# Patient Record
Sex: Female | Born: 1955 | Race: White | Hispanic: No | Marital: Married | Smoking: Never smoker
Health system: Southern US, Community
[De-identification: ages and names within clinical notes are randomized; demographics above are authoritative.]

## PROBLEM LIST (undated history)

## (undated) DIAGNOSIS — M1990 Unspecified osteoarthritis, unspecified site: Secondary | ICD-10-CM

## (undated) DIAGNOSIS — R519 Headache, unspecified: Secondary | ICD-10-CM

## (undated) DIAGNOSIS — K5792 Diverticulitis of intestine, part unspecified, without perforation or abscess without bleeding: Secondary | ICD-10-CM

## (undated) DIAGNOSIS — M316 Other giant cell arteritis: Secondary | ICD-10-CM

## (undated) DIAGNOSIS — L0390 Cellulitis, unspecified: Secondary | ICD-10-CM

## (undated) DIAGNOSIS — E785 Hyperlipidemia, unspecified: Secondary | ICD-10-CM

## (undated) DIAGNOSIS — T4145XA Adverse effect of unspecified anesthetic, initial encounter: Secondary | ICD-10-CM

## (undated) DIAGNOSIS — M797 Fibromyalgia: Secondary | ICD-10-CM

## (undated) DIAGNOSIS — J45909 Unspecified asthma, uncomplicated: Secondary | ICD-10-CM

## (undated) DIAGNOSIS — R51 Headache: Secondary | ICD-10-CM

## (undated) DIAGNOSIS — T8859XA Other complications of anesthesia, initial encounter: Secondary | ICD-10-CM

## (undated) DIAGNOSIS — M545 Low back pain, unspecified: Secondary | ICD-10-CM

## (undated) DIAGNOSIS — R7303 Prediabetes: Secondary | ICD-10-CM

## (undated) HISTORY — DX: Prediabetes: R73.03

## (undated) HISTORY — DX: Low back pain: M54.5

## (undated) HISTORY — PX: WRIST SURGERY: SHX841

## (undated) HISTORY — PX: OTHER SURGICAL HISTORY: SHX169

## (undated) HISTORY — DX: Unspecified asthma, uncomplicated: J45.909

## (undated) HISTORY — DX: Hyperlipidemia, unspecified: E78.5

## (undated) HISTORY — DX: Headache: R51

## (undated) HISTORY — DX: Other giant cell arteritis: M31.6

## (undated) HISTORY — PX: ROTATOR CUFF REPAIR: SHX139

## (undated) HISTORY — PX: CHOLECYSTECTOMY: SHX55

## (undated) HISTORY — DX: Fibromyalgia: M79.7

## (undated) HISTORY — DX: Headache, unspecified: R51.9

## (undated) HISTORY — PX: TENNIS ELBOW RELEASE/NIRSCHEL PROCEDURE: SHX6651

## (undated) HISTORY — PX: INCISION AND DRAINAGE: SHX5863

## (undated) HISTORY — DX: Low back pain, unspecified: M54.50

## (undated) HISTORY — DX: Unspecified osteoarthritis, unspecified site: M19.90

## (undated) HISTORY — PX: APPENDECTOMY: SHX54

## (undated) HISTORY — PX: BREAST SURGERY: SHX581

## (undated) HISTORY — PX: ANKLE SURGERY: SHX546

## (undated) HISTORY — PX: TEMPORAL ARTERY BIOPSY / LIGATION: SUR132

---

## 2014-04-01 HISTORY — PX: HERNIA REPAIR: SHX51

## 2014-08-12 ENCOUNTER — Ambulatory Visit (INDEPENDENT_AMBULATORY_CARE_PROVIDER_SITE_OTHER): Payer: BLUE CROSS/BLUE SHIELD | Admitting: Internal Medicine

## 2014-08-12 ENCOUNTER — Ambulatory Visit (INDEPENDENT_AMBULATORY_CARE_PROVIDER_SITE_OTHER)
Admission: RE | Admit: 2014-08-12 | Discharge: 2014-08-12 | Disposition: A | Discharge status: Home / Self Care | Payer: BLUE CROSS/BLUE SHIELD | Source: Ambulatory Visit | Attending: Internal Medicine | Admitting: Internal Medicine

## 2014-08-12 ENCOUNTER — Encounter: Payer: Self-pay | Admitting: Internal Medicine

## 2014-08-12 VITALS — BP 124/82 | HR 70 | Ht 61.0 in | Wt 162.0 lb

## 2014-08-12 DIAGNOSIS — J4530 Mild persistent asthma, uncomplicated: Secondary | ICD-10-CM

## 2014-08-12 NOTE — Progress Notes (Signed)
Quick Note:  Spoke with pt and notified of results per Dr. Wert. Pt verbalized understanding and denied any questions.  ______ 

## 2014-08-12 NOTE — Progress Notes (Signed)
Subjective:     Patient ID: Noel JourneyRosemary Ratz, female   DOB: [birth year],    MRN: [id number]  HPI  [age] yowf never smoker dx asthma 2008 in [place] did poorly on advair/ well controlled since 2014 on dulera and complicated by post op Atx /march 2016 s/p [place] referred 08/12/2014 by Shellia CarwinKim Bricoe for asthma eval    08/12/2014 1st [facility] office visit/ Amberlea Spagnuolo   Chief Complaint  Patient presents with  . Pulmonary Consult    Referred by Dr. Anna GenreStephanie Powers. Pt states she was dxed with asthma approx 8 yrs ago. She states her breathing is currently stable.  She states that her symptoms tend to flare in the fall. She does c/o chest tightness- unsure if this is related her fibromyalgia.   Not limited by breathing from desired activities  - chest tightness x years does not necessarily correlate with exac of sob/wheezing or respond to saba  No obvious day to day or daytime variability or assoc chronic cough or classically pleuritic or ex  cp  subjective wheeze or overt sinus or hb symptoms. No unusual exp hx or h/o childhood pna/ asthma or knowledge of premature birth.  Sleeping ok without nocturnal  or early am exacerbation  of respiratory  c/o's or need for noct saba. Also denies any obvious fluctuation of symptoms with weather or environmental changes or other aggravating or alleviating factors except as outlined above   Current Medications, Allergies, Complete Past Medical History, Past Surgical History, Family History, and Social History were reviewed in [facility] electronic medical record.  ROS  The following are not active complaints unless bolded sore throat, dysphagia, dental problems, itching, sneezing,  nasal congestion or excess/ purulent secretions, ear ache,   fever, chills, sweats, unintended wt loss, hemoptysis,  orthopnea pnd or leg swelling, presyncope, palpitations, abdominal pain, anorexia, nausea, vomiting, diarrhea  or change in bowel or bladder  habits, change in stools or urine, dysuria,hematuria,  rash, arthralgias/myalgias x years s change recently, visual complaints, headache, numbness, weakness or ataxia or problems with walking or coordination,  change in mood/affect or memory.         Review of Systems     Objective:   Physical Exam    amb wf nad   Wt Readings from Last 3 Encounters:  08/12/14 162 lb (73.483 kg)    Vital signs reviewed   HEENT: nl dentition, turbinates, and orophanx. Nl external ear canals without cough reflex   NECK :  without JVD/Nodes/TM/ nl carotid upstrokes bilaterally   LUNGS: no acc muscle use, clear to A and P bilaterally without cough on insp or exp maneuvers   CV:  RRR  no s3 or murmur or increase in P2, no edema   ABD:  soft and nontender with nl excursion in the supine position. No bruits or organomegaly, bowel sounds nl  MS:  warm without deformities, calf tenderness, cyanosis or clubbing  SKIN: warm and dry without lesions    NEURO:  alert, approp, no deficits      I personally reviewed images and agree with radiology impression as follows:  CXR:  08/12/2014  The lungs are well-expanded and clear. The heart and pulmonary vascularity are normal. The mediastinum is normal in width. There is no pleural effusion. The bony thorax is unremarkable.  Assessment:

## 2014-08-12 NOTE — Patient Instructions (Addendum)
Nexium 40 mg Take 30-60 min before first meal of the day and pepcid 20 mg at bedtime if any flare of respiratory symptoms   GERD (REFLUX)  is an extremely common cause of respiratory symptoms just like yours , many times with no obvious heartburn at all.    It can be treated with medication, but also with lifestyle changes including elevation of the head of your bed (ideally with 6 inch  bed blocks),  Smoking cessation, avoidance of late meals, excessive alcohol, and avoid fatty foods, chocolate, peppermint, colas, red wine, and acidic juices such as orange juice.  NO MINT OR MENTHOL PRODUCTS SO NO COUGH DROPS  USE SUGARLESS CANDY INSTEAD (Jolley ranchers or Stover's or Life Savers) or even ice chips will also do - the key is to swallow to prevent all throat clearing. NO OIL BASED VITAMINS - use powdered substitutes.  Please remember to go to the x-ray department downstairs for your tests - we will call you with the results when they are available.  Please schedule a follow up office visit in 6 weeks, call sooner if needed with pfts

## 2014-08-13 ENCOUNTER — Encounter: Payer: Self-pay | Admitting: Internal Medicine

## 2014-08-13 NOTE — Assessment & Plan Note (Signed)
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   These are the same pts (now being labeled as having "irritable larynx syndrome" by some cough centers) who not infrequently have a history of having failed to tolerate ace inhibitors,  dry powder inhalers or biphosphonates or report having atypical reflux symptoms that don't respond to standard doses of PPI , and are easily confused as having aecopd or asthma flares by even experienced allergists/ pulmonologists.   My rec therefore is max rx for gerd at the very onset of any resp symptoms at all to see how much better she does with next flare   Total face  time = 6928m review case with pt/ discussion/ counseling/ giving and going over instructions (see avs) and making sure she can use her hfa effectively

## 2014-09-24 ENCOUNTER — Ambulatory Visit (INDEPENDENT_AMBULATORY_CARE_PROVIDER_SITE_OTHER): Payer: BLUE CROSS/BLUE SHIELD | Admitting: Internal Medicine

## 2014-09-24 ENCOUNTER — Encounter: Payer: Self-pay | Admitting: Internal Medicine

## 2014-09-24 VITALS — BP 118/66 | HR 83 | Ht 61.0 in | Wt 167.0 lb

## 2014-09-24 DIAGNOSIS — E669 Obesity, unspecified: Secondary | ICD-10-CM | POA: Insufficient documentation

## 2014-09-24 DIAGNOSIS — J4530 Mild persistent asthma, uncomplicated: Secondary | ICD-10-CM

## 2014-09-24 LAB — PULMONARY FUNCTION TEST
DL/VA % PRED: 111 %
DL/VA: 4.9 ml/min/mmHg/L
DLCO UNC % PRED: 103 %
DLCO unc: 21 ml/min/mmHg
FEF 25-75 PRE: 2.02 L/s
FEF 25-75 Post: 1.92 L/sec
FEF2575-%CHANGE-POST: -4 %
FEF2575-%Pred-Post: 88 %
FEF2575-%Pred-Pre: 92 %
FEV1-%Change-Post: -4 %
FEV1-%PRED-POST: 86 %
FEV1-%Pred-Pre: 90 %
FEV1-Post: 1.97 L
FEV1-Pre: 2.05 L
FEV1FVC-%Change-Post: 2 %
FEV1FVC-%Pred-Pre: 104 %
FEV6-%Change-Post: -6 %
FEV6-%PRED-POST: 82 %
FEV6-%Pred-Pre: 88 %
FEV6-POST: 2.35 L
FEV6-PRE: 2.52 L
FEV6FVC-%PRED-POST: 103 %
FEV6FVC-%PRED-PRE: 103 %
FVC-%CHANGE-POST: -6 %
FVC-%PRED-POST: 80 %
FVC-%Pred-Pre: 85 %
FVC-Post: 2.35 L
FVC-Pre: 2.52 L
Post FEV1/FVC ratio: 84 %
Post FEV6/FVC ratio: 100 %
Pre FEV1/FVC ratio: 82 %
Pre FEV6/FVC Ratio: 100 %

## 2014-09-24 NOTE — Progress Notes (Signed)
Subjective:     Patient ID: Erica Caldwell, female   DOB: 02/24/1955    MRN: [id number]    Brief patient profile:  65 yowf never smoker dx asthma 2008 in [place] did poorly on advair/ well controlled since 2014 on dulera and complicated by post op Atx /march 2016 s/p ventral hernia repair in [place] referred 08/12/2014 by Shellia Carwin for asthma eval. With nl pfts on dulera 200bid/singulair maint rx 09/24/2014    History of Present Illness  08/12/2014 1st [facility] office visit/ Wert   Chief Complaint  Patient presents with  . Pulmonary Consult    Referred by Dr. Anna Genre. Pt states she was dxed with asthma approx 8 yrs ago. She states her breathing is currently stable.  She states that her symptoms tend to flare in the fall. She does c/o chest tightness- unsure if this is related her fibromyalgia.   Not limited by breathing from desired activities  - chest tightness x years does not necessarily correlate with exac of sob/wheezing or respond to saba rec Nexium 40 mg Take 30-60 min before first meal of the day and pepcid 20 mg at bedtime if any flare of respiratory symptoms  GERD diet      09/24/2014 f/u ov/Wert re: ? Asthma on dulera 200 2 each and no rescue with pfts nl except for low erv  Chief Complaint  Patient presents with  . Follow-up    PFT done today. Pt states that her breathing is doing well.  She has not had to use rescue inhaler.      No obvious day to day or daytime variability or assoc chronic cough or classically pleuritic or ex  cp  subjective wheeze or overt sinus or hb symptoms. No unusual exp hx or h/o childhood pna/ asthma or knowledge of premature birth.  Sleeping ok without nocturnal  or early am exacerbation  of respiratory  c/o's or need for noct saba. Also denies any obvious fluctuation of symptoms with weather or environmental changes or other aggravating or alleviating factors except as outlined above   Current Medications, Allergies,  Complete Past Medical History, Past Surgical History, Family History, and Social History were reviewed in [facility] record.  ROS  The following are not active complaints unless bolded sore throat, dysphagia, dental problems, itching, sneezing,  nasal congestion or excess/ purulent secretions, ear ache,   fever, chills, sweats, unintended wt loss, hemoptysis,  orthopnea pnd or leg swelling, presyncope, palpitations, abdominal pain, anorexia, nausea, vomiting, diarrhea  or change in bowel or bladder habits, change in stools or urine, dysuria,hematuria,  rash, arthralgias/myalgias x years s change  , visual complaints, headache, numbness, weakness or ataxia or problems with walking or coordination,  change in mood/affect or memory.             Objective:   Physical Exam    amb wf nad  09/24/2014        167  Wt Readings from Last 3 Encounters:  08/12/14 162 lb (73.483 kg)    Vital signs reviewed   HEENT: nl dentition, turbinates, and orophanx. Nl external ear canals without cough reflex   NECK :  without JVD/Nodes/TM/ nl carotid upstrokes bilaterally   LUNGS: no acc muscle use, clear to A and P bilaterally without cough on insp or exp maneuvers   CV:  RRR  no s3 or murmur or increase in P2, no edema   ABD:  soft and nontender with nl excursion in the  supine position. No bruits or organomegaly, bowel sounds nl  MS:  warm without deformities, calf tenderness, cyanosis or clubbing  SKIN: warm and dry without lesions    NEURO:  alert, approp, no deficits      I personally reviewed images and agree with radiology impression as follows:  CXR:  08/12/2014  The lungs are well-expanded and clear. The heart and pulmonary vascularity are normal. The mediastinum is normal in width. There is no pleural effusion. The bony thorax is unremarkable.  Assessment:     Outpatient Encounter Prescriptions as of 09/24/2014  Medication Sig  . acetaminophen (TYLENOL) 325  MG tablet Take 650 mg by mouth every 6 (six) hours as needed.  Marland Kitchen albuterol (PROAIR HFA) 108 (90 BASE) MCG/ACT inhaler Inhale 2 puffs into the lungs every 6 (six) hours as needed for wheezing or shortness of breath.  . Calcium Carb-Cholecalciferol (CALCIUM 600 + D PO) Take 1 tablet by mouth daily.  . clindamycin (CLEOCIN) 300 MG capsule Take 300 mg by mouth 4 (four) times daily.  Marland Kitchen dexlansoprazole (DEXILANT) 60 MG capsule Take 60 mg by mouth daily.  . Flavocoxid (LIMBREL) 500 MG CAPS Take 1 capsule by mouth 2 (two) times daily.  . folic acid (FOLVITE) 400 MCG tablet Take 400 mcg by mouth daily.  Marland Kitchen gabapentin (NEURONTIN) 300 MG capsule Take 600 mg by mouth at bedtime.   Marland Kitchen levothyroxine (SYNTHROID, LEVOTHROID) 75 MCG tablet Take 75 mcg by mouth daily before breakfast.  . loratadine (CLARITIN) 10 MG tablet Take 10 mg by mouth daily.  Marland Kitchen lovastatin (MEVACOR) 40 MG tablet Take 40 mg by mouth at bedtime.  . mometasone-formoterol (DULERA) 200-5 MCG/ACT AERO Inhale 2 puffs into the lungs daily.  . montelukast (SINGULAIR) 10 MG tablet Take 10 mg by mouth at bedtime.  Marland Kitchen venlafaxine XR (EFFEXOR-XR) 37.5 MG 24 hr capsule Take 37.5 mg by mouth daily with breakfast.  . [DISCONTINUED] esomeprazole (NEXIUM) 40 MG capsule Take 40 mg by mouth daily at 12 noon.  . [DISCONTINUED] fexofenadine (ALLEGRA) 180 MG tablet Take 180 mg by mouth daily.  . [DISCONTINUED] oxyCODONE-acetaminophen (PERCOCET/ROXICET) 5-325 MG per tablet Take 1-2 tablets by mouth every 4 (four) hours as needed for severe pain.   No facility-administered encounter medications on file as of 09/24/2014.

## 2014-09-24 NOTE — Assessment & Plan Note (Addendum)
All goals of chronic asthma control met including optimal function and elimination of symptoms with minimal need for rescue therapy.  Contingencies discussed in full including contacting this office immediately if not controlling the symptoms using the rule of two's.    I had an extended summary final  discussion with the patient reviewing all relevant studies completed to date and  lasting 15 to 20 minutes of a 25 minute visit   1) the only remaining abnormality on PFTs is related to her weight, not airflow obstruction.  2) since she is doing so well I think it's reasonable to continue dulera 200 2 puffs every morning and then add the second 2 puffs for any breakthrough symptoms   3) Each maintenance medication was reviewed in detail including most importantly the difference between maintenance and prns and under what circumstances the prns are to be triggered using an action plan format that is not reflected in the computer generated alphabetically organized AVS.    Please see instructions for details which were reviewed in writing and the patient given a copy highlighting the part that I personally wrote and discussed at today's ov.

## 2014-09-24 NOTE — Assessment & Plan Note (Signed)
Body mass index is 31.57  No results found for: TSH   Contributing to low ERV on pfts 09/24/2014 and gerd tendency/ doe/reviewed need  achieve and maintain neg calorie balance > defer f/u primary care including intermittently monitoring thyroid status

## 2014-09-24 NOTE — Progress Notes (Signed)
PFT performed today. Patient refused to perform lung volumes due to a bad experience in the past.

## 2014-09-24 NOTE — Patient Instructions (Signed)
Continue dulera 200 2 each am with the second dose of 2 pffs 12 hours later if any respiratory symptoms  Blow dulera out thru nose    If you are satisfied with your treatment plan,  let your doctor know and he/she can either refill your medications or you can return here when your prescription runs out.     If in any way you are not 100% satisfied,  please tell us.  If 100% better, tell your friends!  Pulmonary follow up is as needed

## 2014-09-25 ENCOUNTER — Encounter: Payer: Self-pay | Admitting: Internal Medicine

## 2016-01-14 ENCOUNTER — Other Ambulatory Visit: Payer: Self-pay | Admitting: Rheumatology

## 2016-01-19 ENCOUNTER — Other Ambulatory Visit: Payer: Self-pay | Admitting: Rheumatology

## 2016-01-19 DIAGNOSIS — M316 Other giant cell arteritis: Secondary | ICD-10-CM

## 2016-01-27 ENCOUNTER — Ambulatory Visit
Admission: RE | Admit: 2016-01-27 | Discharge: 2016-01-27 | Disposition: A | Discharge status: Home / Self Care | Payer: BLUE CROSS/BLUE SHIELD | Source: Ambulatory Visit | Attending: Rheumatology | Admitting: Rheumatology

## 2016-01-27 DIAGNOSIS — M316 Other giant cell arteritis: Secondary | ICD-10-CM

## 2016-08-29 ENCOUNTER — Encounter: Payer: Self-pay | Admitting: Neurology

## 2016-08-29 ENCOUNTER — Encounter: Payer: Self-pay | Admitting: *Deleted

## 2016-08-29 ENCOUNTER — Ambulatory Visit (INDEPENDENT_AMBULATORY_CARE_PROVIDER_SITE_OTHER): Payer: BLUE CROSS/BLUE SHIELD | Admitting: Neurology

## 2016-08-29 DIAGNOSIS — G43709 Chronic migraine without aura, not intractable, without status migrainosus: Secondary | ICD-10-CM | POA: Diagnosis not present

## 2016-08-29 DIAGNOSIS — IMO0002 Reserved for concepts with insufficient information to code with codable children: Secondary | ICD-10-CM | POA: Insufficient documentation

## 2016-08-29 MED ORDER — SUMATRIPTAN SUCCINATE 50 MG PO TABS: 50.0000 mg | ORAL_TABLET | ORAL | 6 refills | Status: AC | PRN

## 2016-08-29 MED ORDER — TOPIRAMATE 100 MG PO TABS: 100.0000 mg | ORAL_TABLET | Freq: Two times a day (BID) | ORAL | 11 refills | Status: DC

## 2016-08-29 NOTE — Progress Notes (Signed)
PATIENT: Erica Caldwell DOB: 04-19-1955  Chief Complaint  Patient presents with  . Migraine    Reports frequent headaches associated with vision disturbances.  She is currently being treated for temporal arteritis.  She has recently had an unremarkable eye exam.  . Rheumatology    Lahoma Rocker, MD  . PCP    Katherina Mires, MD     HISTORICAL  Erica Caldwell is a 61 years old right-handed female, seen in refer by her primary care doctor Lahoma Rocker for evaluation of chronic migraine, initial evaluation was on August 29 2016.  I reviewed and summarized most recent referring, she had a history of arthritis, fibromyalgia, chronic low back pain, asthma, had a history of left rotator cuff repair in 2014, left wrist, right ankle surgery, temporal artery biopsy in October 2017, there was normal.  She has a long history of migraine headache, typical migraine are right lateralized severe pounding headache with associated light noise sensitivity, nauseous, lasting 2-3 days, during the headache, she often have flashing light in her visual field proceeding to the onset of headaches, per patient, she was treated with antiepileptic medication many years ago, which has helped her headache,  She rarely has migraine headaches, round August 2017, she began to develop right persistent temporoparietal area low pressure pain, skin sensitivity, initially was intermittent, but September 2017, she noticed persistent symptoms, also complains of jaw pain, hurt to chew, as soon intermittent blurry vision, she was seen by ENT, had first right temporal artery biopsy, there was normal,  Per record, January 13 2016, sensory deficit, C-reactive protein of 6,  Clinically she was highly suspicious for joints cell arteritis, carotid Dopplers showed no significant stenosis, she was started on prednisone 60 mg daily in December 2017, complains of significant side effect including increased blood pressure, feeling  anxious, palpitation, was lowered to 40 mg daily in 2 weeks, her symptoms improved and resolved but she continue have side effect, then prednisone dose was continued tapered off the lower dose, while she was taking prednisone 20 mg daily by April 06 2016, she noticed recurrent right temporal headaches, jaw claudication, right skull sensitivity, the prednisone dosage was increased to 25 mg daily, noticed improvement of her symptoms,   Over the last few months, her prednisone was continued tapering to 3 mg on Jun 22 2016, she continue complains of low-grade right-sided pressure headache, skin sensitivity, she was recently treated with higher dose of prednisone 40 mg daily because of her upper respiratory symptoms, she denies significant improvement,  She also tried over-the-counter Tylenol, NSAIDs for headache with limited help, she did has diffuse body achy pain, fibromyalgia,  Laboratory evaluations in July 2018, normal C-reactive protein 0.5, mild elevated WBC 11.2, normal hemoglobin of 15, CMP with glucose of 109, normal ESR,   REVIEW OF SYSTEMS: Full 14 system review of systems performed and notable only for as above   ALLERGIES: Allergies  Allergen Reactions  . Levaquin  [Levofloxacin In D5w] Palpitations  . Other Itching and Rash    Perfume- shortness of breath; watery eyes; headache  . Latex Hives  . Penicillins Other (See Comments)    Unsure of reaction  . Silver Hives    Patient complains of blisters/itching all over body.   . Cefazolin Other (See Comments)  . Chloraprep One Step [Chlorhexidine Gluconate] Rash  . Tape Rash    Including tegaderm    HOME MEDICATIONS: Current Outpatient Prescriptions  Medication Sig Dispense Refill  . acetaminophen (TYLENOL) 325  MG tablet Take 650 mg by mouth every 6 (six) hours as needed.    Marland Kitchen albuterol (PROAIR HFA) 108 (90 BASE) MCG/ACT inhaler Inhale 2 puffs into the lungs every 6 (six) hours as needed for wheezing or shortness of breath.    Erica Caldwell Calcium (STOOL SOFTENER PO) Take by mouth as needed.    . DULoxetine (CYMBALTA) 30 MG capsule Take 30 mg by mouth daily.    Marland Kitchen esomeprazole (NEXIUM) 40 MG capsule Take 40 mg by mouth daily at 12 noon.    . gabapentin (NEURONTIN) 300 MG capsule Take 900 mg by mouth at bedtime.    Marland Kitchen HYDROcodone-acetaminophen (NORCO/VICODIN) 5-325 MG tablet Take 1-2 tablets by mouth every 6 (six) hours as needed for moderate pain.    Marland Kitchen levothyroxine (SYNTHROID, LEVOTHROID) 75 MCG tablet Take 75 mcg by mouth daily before breakfast.    . lovastatin (MEVACOR) 40 MG tablet Take 40 mg by mouth at bedtime.    . metFORMIN (GLUCOPHAGE-XR) 500 MG 24 hr tablet Take 500 mg by mouth daily with breakfast.    . mometasone-formoterol (DULERA) 200-5 MCG/ACT AERO Inhale 2 puffs into the lungs daily.    . predniSONE (DELTASONE) 20 MG tablet Take 40 mg by mouth daily with breakfast.    . Triamcinolone Acetonide (TRIAMCINOLONE 0.1 % CREAM : EUCERIN) CREA Apply 1 application topically as needed.     No current facility-administered medications for this visit.     PAST MEDICAL HISTORY: Past Medical History:  Diagnosis Date  . Arthritis   . Asthma   . Fibromyalgia   . Headache   . Hyperlipidemia   . Low back pain   . Pre-diabetes   . Temporal arteritis ([place])     PAST SURGICAL HISTORY: Past Surgical History:  Procedure Laterality Date  . ANKLE SURGERY Right   . APPENDECTOMY    . CARPAL TUNNEL RELEASE Left   . HERNIA REPAIR  04/01/14  . ROTATOR CUFF REPAIR Left    x 2  . TEMPORAL ARTERY BIOPSY / LIGATION    . WRIST SURGERY Left     FAMILY HISTORY: Family History  Problem Relation Age of Onset  . Allergies Brother   . Irritable bowel syndrome Brother   . Asthma Sister   . Irritable bowel syndrome Sister   . Heart disease Mother   . Arthritis Mother   . Stroke Mother   . Heart attack Mother   . Heart disease Father   . Colon cancer Father   . Stroke Father   . Heart attack Father   . Other Father         Guillain-Barre    SOCIAL HISTORY:  Social History   Social History  . Marital status: Married    Spouse name: N/A  . Number of children: 3  . Years of education: HS   Occupational History  . Retired    Social History Main Topics  . Smoking status: Never Smoker  . Smokeless tobacco: Never Used  . Alcohol use No  . Drug use: No  . Sexual activity: Not on file   Other Topics Concern  . Not on file   Social History Narrative   Lives at home with husband, parents, sister and brother-in-law.   Right-handed.   No caffeine on a daily basis.     PHYSICAL EXAM   Vitals:   08/29/16 1152  BP: 129/80  Pulse: 78  Weight: 172 lb 8 oz (78.2 kg)  Height: 5' 1"  (1.549  m)    Not recorded      Body mass index is 32.59 kg/m.  PHYSICAL EXAMNIATION:  Gen: NAD, conversant, well nourised, obese, well groomed                     Cardiovascular: Regular rate rhythm, no peripheral edema, warm, nontender. Eyes: Conjunctivae clear without exudates or hemorrhage Neck: Supple, no carotid bruits. Pulmonary: Clear to auscultation bilaterally   NEUROLOGICAL EXAM:  MENTAL STATUS: Speech:    Speech is normal; fluent and spontaneous with normal comprehension.  Cognition:     Orientation to time, place and person     Normal recent and remote memory     Normal Attention span and concentration     Normal Language, naming, repeating,spontaneous speech     Fund of knowledge   CRANIAL NERVES: CN II: Visual fields are full to confrontation. Fundoscopic exam is normal with sharp discs and no vascular changes. Pupils are round equal and briskly reactive to light. CN III, IV, VI: extraocular movement are normal. No ptosis. CN V: Facial sensation is intact to pinprick in all 3 divisions bilaterally. Corneal responses are intact.  CN VII: Face is symmetric with normal eye closure and smile. CN VIII: Hearing is normal to rubbing fingers CN IX, X: Palate elevates symmetrically. Phonation is  normal. CN XI: Head turning and shoulder shrug are intact CN XII: Tongue is midline with normal movements and no atrophy.  MOTOR: There is no pronator drift of out-stretched arms. Muscle bulk and tone are normal. Muscle strength is normal.  REFLEXES: Reflexes are 2+ and symmetric at the biceps, triceps, knees, and ankles. Plantar responses are flexor.  SENSORY: Intact to light touch, pinprick, positional sensation and vibratory sensation are intact in fingers and toes.  COORDINATION: Rapid alternating movements and fine finger movements are intact. There is no dysmetria on finger-to-nose and heel-knee-shin.    GAIT/STANCE: Posture is normal. Gait is steady with normal steps, base, arm swing, and turning. Heel and toe walking are normal. Tandem gait is normal.  Romberg is absent.   DIAGNOSTIC DATA (LABS, IMAGING, TESTING) - I reviewed patient records, labs, notes, testing and imaging myself where available.   ASSESSMENT AND PLAN  Erica Caldwell is a 61 y.o. female   Chronic migraine headaches   new onset persistent headache still with migraine features  MRI brain to rule out structural relation  Add on Topamax 100 mg twice a day   Imitrex 50 mg as needed     Marcial Pacas, M.D. Ph.D.  [facility] [address], [place], [place] [address] Ph: [phone] Fax: [phone]  CC: Lahoma Rocker, MD

## 2016-09-05 ENCOUNTER — Telehealth: Payer: Self-pay | Admitting: Neurology

## 2016-09-05 NOTE — Telephone Encounter (Signed)
Per Dr Terrace ArabiaYan, patient can try cutting 100mg  tablet in half and take 50mg  at bedtime for 2 weeks. If she is doing well, she can increase to whole tablet (100mg ) at bedtime. If she does well with this, dose can be increased from here.

## 2016-09-05 NOTE — Telephone Encounter (Signed)
Called and spoke with patient. Relayed message below. She verbalized understanding. She will call back if she has any further questions or concerns.

## 2016-09-05 NOTE — Telephone Encounter (Signed)
I called the patient to remind her of her MRI appointment this Wednesday. She informed me that she is having problems with the Topamax at 100 mg she was wondering if she could go down to 50 mg?

## 2016-09-05 NOTE — Telephone Encounter (Signed)
Called patient back. She takes 100mg  topamax at bedtime. Makes her very sleepy. Cannot take daytime dose d/t this. Patient wondering if she can she lower the dose to see if she tolerates it okay. Started rx 08/29/16.  I educated patient that it may take 2-3 weeks for her to adjust to new dose of medication. She would still like me to talk with Dr Terrace ArabiaYan to see if she can lower dose.   Verified pharmacy:  [facility] [phone] [place] [facility] [place] [facility] [phone] [facility] [phone] (Phone) [phone] (Fax)

## 2016-09-07 ENCOUNTER — Ambulatory Visit (INDEPENDENT_AMBULATORY_CARE_PROVIDER_SITE_OTHER): Payer: BLUE CROSS/BLUE SHIELD

## 2016-09-07 DIAGNOSIS — G43709 Chronic migraine without aura, not intractable, without status migrainosus: Secondary | ICD-10-CM | POA: Diagnosis not present

## 2016-09-07 DIAGNOSIS — IMO0002 Reserved for concepts with insufficient information to code with codable children: Secondary | ICD-10-CM

## 2016-09-12 ENCOUNTER — Telehealth: Payer: Self-pay | Admitting: *Deleted

## 2016-09-12 NOTE — Telephone Encounter (Signed)
Spoke to pt and relayed that her MRI brain study was normal.  She verbalized understanding.

## 2016-09-12 NOTE — Telephone Encounter (Signed)
Spoke to pt and relayed that her MRI brain resulted in normal study.  She verbalized understanding.

## 2016-09-12 NOTE — Telephone Encounter (Signed)
-----   Message from Levert FeinsteinYijun Yan, MD sent at 09/09/2016  9:07 AM EDT ----- Please call pt for normal MRI brain.

## 2016-10-10 ENCOUNTER — Telehealth: Payer: Self-pay | Admitting: Nurse Practitioner

## 2016-10-10 NOTE — Telephone Encounter (Signed)
Pt called to r/s appt with Dr Terrace ArabiaYan, she is booked into the middle of November. So an appt was scheduled with Eber Jonesarolyn on 10/29, the pt is being seen for migraines.  FYI

## 2016-10-13 ENCOUNTER — Ambulatory Visit: Payer: BLUE CROSS/BLUE SHIELD | Admitting: Neurology

## 2016-11-01 ENCOUNTER — Inpatient Hospital Stay (HOSPITAL_COMMUNITY)
Admission: EM | Admit: 2016-11-01 | Discharge: 2016-11-08 | DRG: 330 | Disposition: A | Discharge status: Home Health | Payer: BLUE CROSS/BLUE SHIELD | Attending: General Surgery | Admitting: General Surgery

## 2016-11-01 ENCOUNTER — Encounter (HOSPITAL_COMMUNITY): Payer: Self-pay | Admitting: Family Medicine

## 2016-11-01 DIAGNOSIS — Z91040 Latex allergy status: Secondary | ICD-10-CM

## 2016-11-01 DIAGNOSIS — E876 Hypokalemia: Secondary | ICD-10-CM

## 2016-11-01 DIAGNOSIS — K631 Perforation of intestine (nontraumatic): Secondary | ICD-10-CM | POA: Diagnosis present

## 2016-11-01 DIAGNOSIS — Z6832 Body mass index (BMI) 32.0-32.9, adult: Secondary | ICD-10-CM

## 2016-11-01 DIAGNOSIS — Z800 Family history of malignant neoplasm of digestive organs: Secondary | ICD-10-CM

## 2016-11-01 DIAGNOSIS — E039 Hypothyroidism, unspecified: Secondary | ICD-10-CM | POA: Diagnosis present

## 2016-11-01 DIAGNOSIS — K5720 Diverticulitis of large intestine with perforation and abscess without bleeding: Principal | ICD-10-CM | POA: Diagnosis present

## 2016-11-01 DIAGNOSIS — M1990 Unspecified osteoarthritis, unspecified site: Secondary | ICD-10-CM | POA: Diagnosis present

## 2016-11-01 DIAGNOSIS — K660 Peritoneal adhesions (postprocedural) (postinfection): Secondary | ICD-10-CM | POA: Diagnosis present

## 2016-11-01 DIAGNOSIS — Z881 Allergy status to other antibiotic agents status: Secondary | ICD-10-CM

## 2016-11-01 DIAGNOSIS — G43909 Migraine, unspecified, not intractable, without status migrainosus: Secondary | ICD-10-CM | POA: Diagnosis present

## 2016-11-01 DIAGNOSIS — Z91048 Other nonmedicinal substance allergy status: Secondary | ICD-10-CM

## 2016-11-01 DIAGNOSIS — Z825 Family history of asthma and other chronic lower respiratory diseases: Secondary | ICD-10-CM

## 2016-11-01 DIAGNOSIS — Z7952 Long term (current) use of systemic steroids: Secondary | ICD-10-CM

## 2016-11-01 DIAGNOSIS — Z880 Allergy status to penicillin: Secondary | ICD-10-CM

## 2016-11-01 DIAGNOSIS — K668 Other specified disorders of peritoneum: Secondary | ICD-10-CM

## 2016-11-01 DIAGNOSIS — R7303 Prediabetes: Secondary | ICD-10-CM | POA: Diagnosis present

## 2016-11-01 DIAGNOSIS — M316 Other giant cell arteritis: Secondary | ICD-10-CM | POA: Diagnosis present

## 2016-11-01 DIAGNOSIS — E669 Obesity, unspecified: Secondary | ICD-10-CM | POA: Diagnosis present

## 2016-11-01 DIAGNOSIS — M797 Fibromyalgia: Secondary | ICD-10-CM | POA: Diagnosis present

## 2016-11-01 DIAGNOSIS — K567 Ileus, unspecified: Secondary | ICD-10-CM | POA: Diagnosis present

## 2016-11-01 DIAGNOSIS — Z888 Allergy status to other drugs, medicaments and biological substances status: Secondary | ICD-10-CM

## 2016-11-01 DIAGNOSIS — E785 Hyperlipidemia, unspecified: Secondary | ICD-10-CM | POA: Diagnosis present

## 2016-11-01 DIAGNOSIS — J45909 Unspecified asthma, uncomplicated: Secondary | ICD-10-CM | POA: Diagnosis present

## 2016-11-01 DIAGNOSIS — Z8261 Family history of arthritis: Secondary | ICD-10-CM

## 2016-11-01 DIAGNOSIS — R12 Heartburn: Secondary | ICD-10-CM | POA: Diagnosis not present

## 2016-11-01 NOTE — ED Triage Notes (Signed)
Patient is from home and transported via [facility]. Per EMS, patient is experiencing left groin pain that started yesterday but got worse tonight. Also, complains of nausea. Patient denied no urinary symptoms or recent injury. Patient did have recent right foot surgery recently. Patients spouse reported to EMS he relates her groin pain to overuse of left side from right sided surgery. Patient was in a wheelchair but is able to pivot. Patient has took Tylenol for symptoms.

## 2016-11-02 ENCOUNTER — Encounter (HOSPITAL_COMMUNITY): Admission: EM | Disposition: A | Discharge status: Home Health | Payer: Self-pay | Source: Home / Self Care

## 2016-11-02 ENCOUNTER — Inpatient Hospital Stay (HOSPITAL_COMMUNITY): Payer: BLUE CROSS/BLUE SHIELD | Admitting: Anesthesiology

## 2016-11-02 ENCOUNTER — Encounter (HOSPITAL_COMMUNITY): Payer: Self-pay

## 2016-11-02 ENCOUNTER — Emergency Department (HOSPITAL_COMMUNITY): Payer: BLUE CROSS/BLUE SHIELD

## 2016-11-02 DIAGNOSIS — K631 Perforation of intestine (nontraumatic): Secondary | ICD-10-CM | POA: Diagnosis present

## 2016-11-02 DIAGNOSIS — K668 Other specified disorders of peritoneum: Secondary | ICD-10-CM | POA: Diagnosis present

## 2016-11-02 DIAGNOSIS — Z880 Allergy status to penicillin: Secondary | ICD-10-CM | POA: Diagnosis not present

## 2016-11-02 DIAGNOSIS — K5720 Diverticulitis of large intestine with perforation and abscess without bleeding: Secondary | ICD-10-CM | POA: Diagnosis present

## 2016-11-02 DIAGNOSIS — M797 Fibromyalgia: Secondary | ICD-10-CM | POA: Diagnosis present

## 2016-11-02 DIAGNOSIS — E669 Obesity, unspecified: Secondary | ICD-10-CM | POA: Diagnosis present

## 2016-11-02 DIAGNOSIS — Z91048 Other nonmedicinal substance allergy status: Secondary | ICD-10-CM | POA: Diagnosis not present

## 2016-11-02 DIAGNOSIS — K660 Peritoneal adhesions (postprocedural) (postinfection): Secondary | ICD-10-CM | POA: Diagnosis present

## 2016-11-02 DIAGNOSIS — M1990 Unspecified osteoarthritis, unspecified site: Secondary | ICD-10-CM | POA: Diagnosis present

## 2016-11-02 DIAGNOSIS — Z8261 Family history of arthritis: Secondary | ICD-10-CM | POA: Diagnosis not present

## 2016-11-02 DIAGNOSIS — E785 Hyperlipidemia, unspecified: Secondary | ICD-10-CM | POA: Diagnosis present

## 2016-11-02 DIAGNOSIS — Z881 Allergy status to other antibiotic agents status: Secondary | ICD-10-CM | POA: Diagnosis not present

## 2016-11-02 DIAGNOSIS — K567 Ileus, unspecified: Secondary | ICD-10-CM | POA: Diagnosis present

## 2016-11-02 DIAGNOSIS — J45909 Unspecified asthma, uncomplicated: Secondary | ICD-10-CM | POA: Diagnosis present

## 2016-11-02 DIAGNOSIS — G43909 Migraine, unspecified, not intractable, without status migrainosus: Secondary | ICD-10-CM | POA: Diagnosis present

## 2016-11-02 DIAGNOSIS — Z800 Family history of malignant neoplasm of digestive organs: Secondary | ICD-10-CM | POA: Diagnosis not present

## 2016-11-02 DIAGNOSIS — Z7952 Long term (current) use of systemic steroids: Secondary | ICD-10-CM | POA: Diagnosis not present

## 2016-11-02 DIAGNOSIS — R12 Heartburn: Secondary | ICD-10-CM | POA: Diagnosis not present

## 2016-11-02 DIAGNOSIS — M316 Other giant cell arteritis: Secondary | ICD-10-CM | POA: Diagnosis present

## 2016-11-02 DIAGNOSIS — Z91040 Latex allergy status: Secondary | ICD-10-CM | POA: Diagnosis not present

## 2016-11-02 DIAGNOSIS — Z888 Allergy status to other drugs, medicaments and biological substances status: Secondary | ICD-10-CM | POA: Diagnosis not present

## 2016-11-02 DIAGNOSIS — E039 Hypothyroidism, unspecified: Secondary | ICD-10-CM | POA: Diagnosis present

## 2016-11-02 DIAGNOSIS — R7303 Prediabetes: Secondary | ICD-10-CM | POA: Diagnosis present

## 2016-11-02 DIAGNOSIS — Z6832 Body mass index (BMI) 32.0-32.9, adult: Secondary | ICD-10-CM | POA: Diagnosis not present

## 2016-11-02 DIAGNOSIS — Z825 Family history of asthma and other chronic lower respiratory diseases: Secondary | ICD-10-CM | POA: Diagnosis not present

## 2016-11-02 HISTORY — PX: LAPAROTOMY: SHX154

## 2016-11-02 LAB — CBC WITH DIFFERENTIAL/PLATELET
Basophils Absolute: 0 10*3/uL (ref 0.0–0.1)
Basophils Relative: 0 %
Eosinophils Absolute: 0.1 10*3/uL (ref 0.0–0.7)
Eosinophils Relative: 1 %
HEMATOCRIT: 44.2 % (ref 36.0–46.0)
HEMOGLOBIN: 14.1 g/dL (ref 12.0–15.0)
LYMPHS ABS: 2.6 10*3/uL (ref 0.7–4.0)
Lymphocytes Relative: 24 %
MCH: 30.1 pg (ref 26.0–34.0)
MCHC: 31.9 g/dL (ref 30.0–36.0)
MCV: 94.2 fL (ref 78.0–100.0)
MONOS PCT: 7 %
Monocytes Absolute: 0.8 10*3/uL (ref 0.1–1.0)
NEUTROS ABS: 7.4 10*3/uL (ref 1.7–7.7)
NEUTROS PCT: 68 %
Platelets: 172 10*3/uL (ref 150–400)
RBC: 4.69 MIL/uL (ref 3.87–5.11)
RDW: 15.2 % (ref 11.5–15.5)
WBC: 10.9 10*3/uL — AB (ref 4.0–10.5)

## 2016-11-02 LAB — URINALYSIS, ROUTINE W REFLEX MICROSCOPIC
Bilirubin Urine: NEGATIVE
GLUCOSE, UA: NEGATIVE mg/dL
Hgb urine dipstick: NEGATIVE
KETONES UR: NEGATIVE mg/dL
Nitrite: NEGATIVE
PROTEIN: NEGATIVE mg/dL
Specific Gravity, Urine: 1.012 (ref 1.005–1.030)
pH: 6 (ref 5.0–8.0)

## 2016-11-02 LAB — COMPREHENSIVE METABOLIC PANEL
ALT: 24 U/L (ref 14–54)
ANION GAP: 7 (ref 5–15)
AST: 23 U/L (ref 15–41)
Albumin: 3.7 g/dL (ref 3.5–5.0)
Alkaline Phosphatase: 35 U/L — ABNORMAL LOW (ref 38–126)
BUN: 7 mg/dL (ref 6–20)
CHLORIDE: 115 mmol/L — AB (ref 101–111)
CO2: 24 mmol/L (ref 22–32)
CREATININE: 0.81 mg/dL (ref 0.44–1.00)
Calcium: 9.3 mg/dL (ref 8.9–10.3)
Glucose, Bld: 131 mg/dL — ABNORMAL HIGH (ref 65–99)
Potassium: 2.9 mmol/L — ABNORMAL LOW (ref 3.5–5.1)
SODIUM: 146 mmol/L — AB (ref 135–145)
Total Bilirubin: 0.7 mg/dL (ref 0.3–1.2)
Total Protein: 5.6 g/dL — ABNORMAL LOW (ref 6.5–8.1)

## 2016-11-02 LAB — MRSA PCR SCREENING: MRSA BY PCR: NEGATIVE

## 2016-11-02 LAB — LIPASE, BLOOD: Lipase: 28 U/L (ref 11–51)

## 2016-11-02 SURGERY — LAPAROTOMY, EXPLORATORY
Anesthesia: General

## 2016-11-02 MED ORDER — DIPHENHYDRAMINE HCL 50 MG/ML IJ SOLN: 12.5000 mg | Freq: Four times a day (QID) | INTRAMUSCULAR | Status: DC | PRN

## 2016-11-02 MED ORDER — METHOCARBAMOL 500 MG PO TABS
500.0000 mg | ORAL_TABLET | Freq: Four times a day (QID) | ORAL | Status: DC | PRN
  Administered 2016-11-03: 500 mg via ORAL
  Filled 2016-11-02: qty 1

## 2016-11-02 MED ORDER — MONTELUKAST SODIUM 10 MG PO TABS
10.0000 mg | ORAL_TABLET | Freq: Every day | ORAL | Status: DC
  Administered 2016-11-02 – 2016-11-07 (×6): 10 mg via ORAL
  Filled 2016-11-02 (×6): qty 1

## 2016-11-02 MED ORDER — ONDANSETRON 4 MG PO TBDP
4.0000 mg | ORAL_TABLET | Freq: Four times a day (QID) | ORAL | Status: DC | PRN
Start: 2016-11-02 — End: 2016-11-02

## 2016-11-02 MED ORDER — ROCURONIUM BROMIDE 10 MG/ML (PF) SYRINGE
PREFILLED_SYRINGE | INTRAVENOUS | Status: DC | PRN
  Administered 2016-11-02: 30 mg via INTRAVENOUS

## 2016-11-02 MED ORDER — DIPHENHYDRAMINE HCL 12.5 MG/5ML PO ELIX: 12.5000 mg | ORAL_SOLUTION | Freq: Four times a day (QID) | ORAL | Status: DC | PRN

## 2016-11-02 MED ORDER — PREDNISONE 1 MG PO TABS
1.0000 mg | ORAL_TABLET | Freq: Every day | ORAL | Status: DC
  Administered 2016-11-02 – 2016-11-08 (×7): 1 mg via ORAL
  Filled 2016-11-02 (×9): qty 1

## 2016-11-02 MED ORDER — NALOXONE HCL 0.4 MG/ML IJ SOLN: 0.4000 mg | INTRAMUSCULAR | Status: DC | PRN

## 2016-11-02 MED ORDER — HYDROMORPHONE HCL-NACL 0.5-0.9 MG/ML-% IV SOSY
PREFILLED_SYRINGE | INTRAVENOUS | Status: AC
  Administered 2016-11-02: 0.25 mg via INTRAVENOUS
  Filled 2016-11-02: qty 3

## 2016-11-02 MED ORDER — MIDAZOLAM HCL 5 MG/5ML IJ SOLN
INTRAMUSCULAR | Status: DC | PRN
  Administered 2016-11-02: 2 mg via INTRAVENOUS

## 2016-11-02 MED ORDER — ONDANSETRON HCL 4 MG/2ML IJ SOLN: 4.0000 mg | Freq: Four times a day (QID) | INTRAMUSCULAR | Status: DC | PRN

## 2016-11-02 MED ORDER — HYDROMORPHONE 1 MG/ML IV SOLN
INTRAVENOUS | Status: DC
  Administered 2016-11-02: 0.2 mg via INTRAVENOUS
  Administered 2016-11-02: 0.6 mg via INTRAVENOUS
  Administered 2016-11-03: 0.2 mg via INTRAVENOUS
  Administered 2016-11-03: 0 mg via INTRAVENOUS
  Administered 2016-11-03 (×2): 0.6 mg via INTRAVENOUS
  Administered 2016-11-04: 0.4 mg via INTRAVENOUS
  Administered 2016-11-04 – 2016-11-05 (×2): 0.2 mg via INTRAVENOUS
  Administered 2016-11-05: 0.4 mg via INTRAVENOUS
  Administered 2016-11-05 – 2016-11-06 (×5): 0.2 mg via INTRAVENOUS
  Administered 2016-11-07: 0 mg via INTRAVENOUS
  Administered 2016-11-07: 0.2 mg via INTRAVENOUS
  Filled 2016-11-02: qty 25

## 2016-11-02 MED ORDER — METRONIDAZOLE IN NACL 5-0.79 MG/ML-% IV SOLN
500.0000 mg | Freq: Three times a day (TID) | INTRAVENOUS | Status: DC
  Administered 2016-11-02 (×2): 500 mg via INTRAVENOUS
  Filled 2016-11-02 (×3): qty 100

## 2016-11-02 MED ORDER — ONDANSETRON HCL 4 MG/2ML IJ SOLN
4.0000 mg | Freq: Four times a day (QID) | INTRAMUSCULAR | Status: DC | PRN
Start: 2016-11-02 — End: 2016-11-02

## 2016-11-02 MED ORDER — PANTOPRAZOLE SODIUM 40 MG PO TBEC: 40.0000 mg | DELAYED_RELEASE_TABLET | Freq: Every day | ORAL | Status: DC

## 2016-11-02 MED ORDER — MORPHINE SULFATE (PF) 4 MG/ML IV SOLN
4.0000 mg | Freq: Once | INTRAVENOUS | Status: AC
  Administered 2016-11-02: 4 mg via INTRAVENOUS
  Filled 2016-11-02: qty 1

## 2016-11-02 MED ORDER — OXYCODONE HCL 5 MG PO TABS: 5.0000 mg | ORAL_TABLET | Freq: Once | ORAL | Status: DC | PRN

## 2016-11-02 MED ORDER — MIDAZOLAM HCL 2 MG/2ML IJ SOLN
INTRAMUSCULAR | Status: AC
  Filled 2016-11-02: qty 2

## 2016-11-02 MED ORDER — PROPOFOL 10 MG/ML IV BOLUS
INTRAVENOUS | Status: AC
  Filled 2016-11-02: qty 20

## 2016-11-02 MED ORDER — MOMETASONE FURO-FORMOTEROL FUM 200-5 MCG/ACT IN AERO
2.0000 | INHALATION_SPRAY | Freq: Every day | RESPIRATORY_TRACT | Status: DC
  Administered 2016-11-02 – 2016-11-08 (×5): 2 via RESPIRATORY_TRACT
  Filled 2016-11-02 (×2): qty 8.8

## 2016-11-02 MED ORDER — HYDROMORPHONE 1 MG/ML IV SOLN
INTRAVENOUS | Status: DC
  Administered 2016-11-02: 15:00:00 via INTRAVENOUS
  Filled 2016-11-02: qty 25

## 2016-11-02 MED ORDER — ACETAMINOPHEN 650 MG RE SUPP: 650.0000 mg | Freq: Four times a day (QID) | RECTAL | Status: DC | PRN

## 2016-11-02 MED ORDER — ACETAMINOPHEN 325 MG PO TABS: 650.0000 mg | ORAL_TABLET | Freq: Four times a day (QID) | ORAL | Status: DC | PRN

## 2016-11-02 MED ORDER — HYDROMORPHONE HCL 1 MG/ML IJ SOLN: 1.0000 mg | INTRAMUSCULAR | Status: DC | PRN

## 2016-11-02 MED ORDER — LEVOTHYROXINE SODIUM 50 MCG PO TABS
50.0000 ug | ORAL_TABLET | Freq: Every day | ORAL | Status: DC
Start: 2016-11-03 — End: 2016-11-08
  Administered 2016-11-03 – 2016-11-08 (×6): 50 ug via ORAL
  Filled 2016-11-02 (×6): qty 1

## 2016-11-02 MED ORDER — SODIUM CHLORIDE 0.9% FLUSH: 9.0000 mL | INTRAVENOUS | Status: DC | PRN

## 2016-11-02 MED ORDER — LACTATED RINGERS IV SOLN
INTRAVENOUS | Status: DC | PRN
  Administered 2016-11-02: 1000 mL
  Administered 2016-11-02 (×2): via INTRAVENOUS

## 2016-11-02 MED ORDER — ONDANSETRON HCL 4 MG/2ML IJ SOLN
4.0000 mg | Freq: Four times a day (QID) | INTRAMUSCULAR | Status: DC | PRN
  Administered 2016-11-02: 4 mg via INTRAVENOUS

## 2016-11-02 MED ORDER — PANTOPRAZOLE SODIUM 40 MG IV SOLR
40.0000 mg | Freq: Every day | INTRAVENOUS | Status: DC
Start: 2016-11-02 — End: 2016-11-06
  Administered 2016-11-02 – 2016-11-04 (×3): 40 mg via INTRAVENOUS
  Filled 2016-11-02 (×4): qty 40

## 2016-11-02 MED ORDER — ONDANSETRON HCL 4 MG/2ML IJ SOLN
INTRAMUSCULAR | Status: AC
  Filled 2016-11-02: qty 2

## 2016-11-02 MED ORDER — SODIUM CHLORIDE 0.9 % IV SOLN
1.0000 g | Freq: Three times a day (TID) | INTRAVENOUS | Status: DC
  Administered 2016-11-02 – 2016-11-03 (×5): 1 g via INTRAVENOUS
  Filled 2016-11-02 (×8): qty 1

## 2016-11-02 MED ORDER — ONDANSETRON 4 MG PO TBDP: 4.0000 mg | ORAL_TABLET | Freq: Four times a day (QID) | ORAL | Status: DC | PRN

## 2016-11-02 MED ORDER — PROPOFOL 10 MG/ML IV BOLUS
INTRAVENOUS | Status: DC | PRN
  Administered 2016-11-02: 150 mg via INTRAVENOUS

## 2016-11-02 MED ORDER — KCL IN DEXTROSE-NACL 20-5-0.45 MEQ/L-%-% IV SOLN
INTRAVENOUS | Status: DC
  Administered 2016-11-02: 08:00:00 via INTRAVENOUS
  Filled 2016-11-02: qty 1000

## 2016-11-02 MED ORDER — MEPERIDINE HCL 50 MG/ML IJ SOLN: 6.2500 mg | INTRAMUSCULAR | Status: DC | PRN

## 2016-11-02 MED ORDER — SODIUM CHLORIDE 0.9 % IR SOLN
Status: DC | PRN
  Administered 2016-11-02: 5000 mL

## 2016-11-02 MED ORDER — SUGAMMADEX SODIUM 200 MG/2ML IV SOLN
INTRAVENOUS | Status: DC | PRN
  Administered 2016-11-02: 200 mg via INTRAVENOUS

## 2016-11-02 MED ORDER — OXYCODONE-ACETAMINOPHEN 5-325 MG PO TABS
1.0000 | ORAL_TABLET | ORAL | Status: DC | PRN
  Administered 2016-11-02: 1 via ORAL
  Filled 2016-11-02: qty 1

## 2016-11-02 MED ORDER — SIMETHICONE 80 MG PO CHEW
40.0000 mg | CHEWABLE_TABLET | Freq: Four times a day (QID) | ORAL | Status: DC | PRN
  Filled 2016-11-02: qty 1

## 2016-11-02 MED ORDER — FENTANYL CITRATE (PF) 100 MCG/2ML IJ SOLN
INTRAMUSCULAR | Status: DC | PRN
  Administered 2016-11-02 (×2): 50 ug via INTRAVENOUS

## 2016-11-02 MED ORDER — LACTATED RINGERS IV SOLN: INTRAVENOUS | Status: DC

## 2016-11-02 MED ORDER — LIDOCAINE 2% (20 MG/ML) 5 ML SYRINGE
INTRAMUSCULAR | Status: DC | PRN
  Administered 2016-11-02: 100 mg via INTRAVENOUS

## 2016-11-02 MED ORDER — DIPHENHYDRAMINE HCL 12.5 MG/5ML PO ELIX
12.5000 mg | ORAL_SOLUTION | Freq: Four times a day (QID) | ORAL | Status: DC | PRN
  Filled 2016-11-02: qty 5

## 2016-11-02 MED ORDER — GABAPENTIN 300 MG PO CAPS
900.0000 mg | ORAL_CAPSULE | Freq: Every day | ORAL | Status: DC
  Administered 2016-11-02 – 2016-11-07 (×6): 900 mg via ORAL
  Filled 2016-11-02: qty 9
  Filled 2016-11-02 (×6): qty 3

## 2016-11-02 MED ORDER — TOPIRAMATE 100 MG PO TABS
100.0000 mg | ORAL_TABLET | Freq: Two times a day (BID) | ORAL | Status: DC
  Administered 2016-11-02 – 2016-11-08 (×12): 100 mg via ORAL
  Filled 2016-11-02 (×12): qty 1

## 2016-11-02 MED ORDER — SODIUM CHLORIDE 0.9 % IV BOLUS (SEPSIS)
1000.0000 mL | Freq: Once | INTRAVENOUS | Status: AC
  Administered 2016-11-02: 1000 mL via INTRAVENOUS

## 2016-11-02 MED ORDER — SUMATRIPTAN SUCCINATE 50 MG PO TABS
50.0000 mg | ORAL_TABLET | ORAL | Status: DC | PRN
  Filled 2016-11-02: qty 1

## 2016-11-02 MED ORDER — POTASSIUM CHLORIDE 10 MEQ/100ML IV SOLN
10.0000 meq | Freq: Once | INTRAVENOUS | Status: DC
  Filled 2016-11-02: qty 100

## 2016-11-02 MED ORDER — ONDANSETRON HCL 4 MG/2ML IJ SOLN
4.0000 mg | Freq: Once | INTRAMUSCULAR | Status: AC
  Administered 2016-11-02: 4 mg via INTRAVENOUS
  Filled 2016-11-02: qty 2

## 2016-11-02 MED ORDER — KCL IN DEXTROSE-NACL 20-5-0.45 MEQ/L-%-% IV SOLN: INTRAVENOUS | Status: DC

## 2016-11-02 MED ORDER — DULOXETINE HCL 30 MG PO CPEP
30.0000 mg | ORAL_CAPSULE | Freq: Every day | ORAL | Status: DC
  Administered 2016-11-03 – 2016-11-08 (×6): 30 mg via ORAL
  Filled 2016-11-02 (×6): qty 1

## 2016-11-02 MED ORDER — SUCCINYLCHOLINE CHLORIDE 200 MG/10ML IV SOSY
PREFILLED_SYRINGE | INTRAVENOUS | Status: DC | PRN
  Administered 2016-11-02: 100 mg via INTRAVENOUS

## 2016-11-02 MED ORDER — ROCURONIUM BROMIDE 50 MG/5ML IV SOSY
PREFILLED_SYRINGE | INTRAVENOUS | Status: AC
  Filled 2016-11-02: qty 5

## 2016-11-02 MED ORDER — FENTANYL CITRATE (PF) 100 MCG/2ML IJ SOLN
INTRAMUSCULAR | Status: AC
  Filled 2016-11-02: qty 2

## 2016-11-02 MED ORDER — ALBUTEROL SULFATE (2.5 MG/3ML) 0.083% IN NEBU: 2.5000 mg | INHALATION_SOLUTION | Freq: Four times a day (QID) | RESPIRATORY_TRACT | Status: DC | PRN

## 2016-11-02 MED ORDER — HYDROMORPHONE 1 MG/ML IV SOLN: INTRAVENOUS | Status: DC

## 2016-11-02 MED ORDER — LIDOCAINE 2% (20 MG/ML) 5 ML SYRINGE
INTRAMUSCULAR | Status: AC
  Filled 2016-11-02: qty 5

## 2016-11-02 MED ORDER — DEXAMETHASONE SODIUM PHOSPHATE 10 MG/ML IJ SOLN
INTRAMUSCULAR | Status: AC
  Filled 2016-11-02: qty 1

## 2016-11-02 MED ORDER — ACETAMINOPHEN 325 MG PO TABS
650.0000 mg | ORAL_TABLET | Freq: Four times a day (QID) | ORAL | Status: DC | PRN
  Administered 2016-11-03: 650 mg via ORAL
  Filled 2016-11-02: qty 2

## 2016-11-02 MED ORDER — DEXAMETHASONE SODIUM PHOSPHATE 10 MG/ML IJ SOLN
INTRAMUSCULAR | Status: DC | PRN
Start: 2016-11-02 — End: 2016-11-02
  Administered 2016-11-02: 10 mg via INTRAVENOUS

## 2016-11-02 MED ORDER — PROMETHAZINE HCL 25 MG/ML IJ SOLN: 6.2500 mg | INTRAMUSCULAR | Status: DC | PRN

## 2016-11-02 MED ORDER — KCL IN DEXTROSE-NACL 20-5-0.45 MEQ/L-%-% IV SOLN
INTRAVENOUS | Status: DC
  Administered 2016-11-02 – 2016-11-03 (×2): via INTRAVENOUS
  Administered 2016-11-04: 1000 mL via INTRAVENOUS
  Administered 2016-11-04: 125 mL/h via INTRAVENOUS
  Administered 2016-11-04 – 2016-11-06 (×3): via INTRAVENOUS
  Filled 2016-11-02 (×10): qty 1000

## 2016-11-02 MED ORDER — POTASSIUM CHLORIDE 10 MEQ/100ML IV SOLN
10.0000 meq | Freq: Once | INTRAVENOUS | Status: AC
  Administered 2016-11-02: 10 meq via INTRAVENOUS

## 2016-11-02 MED ORDER — HYDROMORPHONE HCL-NACL 0.5-0.9 MG/ML-% IV SOSY: 0.5000 mg | PREFILLED_SYRINGE | INTRAVENOUS | Status: DC | PRN

## 2016-11-02 MED ORDER — OXYCODONE HCL 5 MG/5ML PO SOLN: 5.0000 mg | Freq: Once | ORAL | Status: DC | PRN

## 2016-11-02 MED ORDER — PRAVASTATIN SODIUM 20 MG PO TABS
40.0000 mg | ORAL_TABLET | Freq: Every day | ORAL | Status: DC
  Administered 2016-11-02 – 2016-11-07 (×6): 40 mg via ORAL
  Filled 2016-11-02 (×8): qty 2

## 2016-11-02 MED ORDER — ENOXAPARIN SODIUM 40 MG/0.4ML ~~LOC~~ SOLN
40.0000 mg | SUBCUTANEOUS | Status: DC
  Administered 2016-11-03 – 2016-11-08 (×6): 40 mg via SUBCUTANEOUS
  Filled 2016-11-02 (×6): qty 0.4

## 2016-11-02 MED ORDER — SUGAMMADEX SODIUM 200 MG/2ML IV SOLN
INTRAVENOUS | Status: AC
  Filled 2016-11-02: qty 2

## 2016-11-02 MED ORDER — HYDROMORPHONE HCL-NACL 0.5-0.9 MG/ML-% IV SOSY
0.2500 mg | PREFILLED_SYRINGE | INTRAVENOUS | Status: DC | PRN
  Administered 2016-11-02 (×2): 0.25 mg via INTRAVENOUS

## 2016-11-02 MED ORDER — SUCCINYLCHOLINE CHLORIDE 200 MG/10ML IV SOSY
PREFILLED_SYRINGE | INTRAVENOUS | Status: AC
  Filled 2016-11-02: qty 10

## 2016-11-02 SURGICAL SUPPLY — 42 items
APPLICATOR COTTON TIP 6IN STRL (MISCELLANEOUS) IMPLANT
BLADE EXTENDED COATED 6.5IN (ELECTRODE) IMPLANT
BLADE HEX COATED 2.75 (ELECTRODE) ×2 IMPLANT
CHLORAPREP W/TINT 26ML (MISCELLANEOUS) IMPLANT
COVER MAYO STAND STRL (DRAPES) ×2 IMPLANT
COVER SURGICAL LIGHT HANDLE (MISCELLANEOUS) ×2 IMPLANT
DRAPE LAPAROSCOPIC ABDOMINAL (DRAPES) ×2 IMPLANT
DRAPE WARM FLUID 44X44 (DRAPE) ×2 IMPLANT
ELECT REM PT RETURN 15FT ADLT (MISCELLANEOUS) ×2 IMPLANT
GAUZE SPONGE 4X4 12PLY STRL (GAUZE/BANDAGES/DRESSINGS) IMPLANT
GLOVE BIOGEL PI IND STRL 7.0 (GLOVE) IMPLANT
GLOVE BIOGEL PI INDICATOR 7.0 (GLOVE)
GLOVE SURG ORTHO 8.0 STRL STRW (GLOVE) ×2 IMPLANT
GOWN STRL REUS W/TWL LRG LVL3 (GOWN DISPOSABLE) ×6 IMPLANT
GOWN STRL REUS W/TWL XL LVL3 (GOWN DISPOSABLE) ×2 IMPLANT
HANDLE SUCTION POOLE (INSTRUMENTS) ×1 IMPLANT
KIT BASIN OR (CUSTOM PROCEDURE TRAY) ×2 IMPLANT
NS IRRIG 1000ML POUR BTL (IV SOLUTION) IMPLANT
PACK GENERAL/GYN (CUSTOM PROCEDURE TRAY) ×2 IMPLANT
SEALER TISSUE X1 CVD JAW (INSTRUMENTS) ×2 IMPLANT
SPONGE LAP 18X18 X RAY DECT (DISPOSABLE) ×2 IMPLANT
STAPLER CUT CVD 40MM GREEN (STAPLE) ×2 IMPLANT
STAPLER PROXIMATE 75MM BLUE (STAPLE) ×2 IMPLANT
STAPLER VISISTAT 35W (STAPLE) ×2 IMPLANT
SUCTION POOLE HANDLE (INSTRUMENTS) ×2
SUT NOV 1 T60/GS (SUTURE) IMPLANT
SUT NOVA NAB DX-16 0-1 5-0 T12 (SUTURE) ×8 IMPLANT
SUT PROLENE 2 0 BLUE (SUTURE) ×2 IMPLANT
SUT SILK 2 0 (SUTURE) ×1
SUT SILK 2 0 SH CR/8 (SUTURE) ×2 IMPLANT
SUT SILK 2-0 18XBRD TIE 12 (SUTURE) ×1 IMPLANT
SUT SILK 3 0 (SUTURE) ×1
SUT SILK 3 0 SH CR/8 (SUTURE) ×2 IMPLANT
SUT SILK 3-0 18XBRD TIE 12 (SUTURE) ×1 IMPLANT
SUT VIC AB 3-0 SH 18 (SUTURE) ×4 IMPLANT
SUT VICRYL 2 0 18  UND BR (SUTURE)
SUT VICRYL 2 0 18 UND BR (SUTURE) IMPLANT
TOWEL OR 17X26 10 PK STRL BLUE (TOWEL DISPOSABLE) ×4 IMPLANT
TRAY FOLEY W/METER SILVER 16FR (SET/KITS/TRAYS/PACK) IMPLANT
TRAY PREP A LATEX SAFE STRL (SET/KITS/TRAYS/PACK) ×2 IMPLANT
WATER STERILE IRR 1500ML POUR (IV SOLUTION) IMPLANT
YANKAUER SUCT BULB TIP NO VENT (SUCTIONS) IMPLANT

## 2016-11-02 NOTE — H&P (Signed)
Erica Caldwell is an 61 y.o. female.   Chief Complaint: abdominal pain HPI: Pt is a 61 yo F who presented to ED with 24-48 hours of left groin pain evolving into LLQ pain with nausea.  She had morton's neuroma removed 2 weeks ago and is in surgical shoe. The pain came on suddenly but she originally felt like she pulled a muscle.  Over the next 24 hours she developed severe pain too severe to walk.  She denies fevers, but had chills. She did not vomit, but complained of signifcant nausea.  She was not able to get into her husband's [facility] and called EMS to get here. She has had a colonoscopy in the last year and had 10 polyps removed.  She did have diverticuli seen at the time.  She has never had diverticulitis before of which she is aware.  She is not normally constipated, but has been recently due to taking pain meds after surgery.    Past Medical History:  Diagnosis Date  . Arthritis   . Asthma   . Fibromyalgia   . Headache   . Hyperlipidemia   . Low back pain   . Pre-diabetes   . Temporal arteritis [facility])     Past Surgical History:  Procedure Laterality Date  . ANKLE SURGERY Right   . APPENDECTOMY    . CARPAL TUNNEL RELEASE Left   . HERNIA REPAIR  04/01/14  . ROTATOR CUFF REPAIR Left    x 2  . TEMPORAL ARTERY BIOPSY / LIGATION    . WRIST SURGERY Left     Family History  Problem Relation Age of Onset  . Allergies Brother   . Irritable bowel syndrome Brother   . Asthma Sister   . Irritable bowel syndrome Sister   . Heart disease Mother   . Arthritis Mother   . Stroke Mother   . Heart attack Mother   . Heart disease Father   . Colon cancer Father   . Stroke Father   . Heart attack Father   . Other Father        Guillain-Barre   Social History:  reports that she has never smoked. She has never used smokeless tobacco. She reports that she does not drink alcohol or use drugs.  Allergies:  Allergies  Allergen Reactions  . Levaquin  [Levofloxacin In D5w] Palpitations  .  Other Itching and Rash    Perfume- shortness of breath; watery eyes; headache  . Latex Hives  . Penicillins Other (See Comments)    Unsure of reaction  . Silver Hives    Patient complains of blisters/itching all over body.   . Cefazolin Other (See Comments)  . Chloraprep One Step [Chlorhexidine Gluconate] Rash  . Tape Rash    Including tegaderm    Current Meds  Medication Sig  . acetaminophen (TYLENOL) 325 MG tablet Take 650 mg by mouth every 6 (six) hours as needed for mild pain.   Marland Kitchen albuterol (PROAIR HFA) 108 (90 BASE) MCG/ACT inhaler Inhale 2 puffs into the lungs every 6 (six) hours as needed for wheezing or shortness of breath.  . docusate sodium (COLACE) 100 MG capsule Take 100 mg by mouth daily as needed for mild constipation.  . DULoxetine (CYMBALTA) 30 MG capsule Take 30 mg by mouth daily.  Marland Kitchen esomeprazole (NEXIUM) 40 MG capsule Take 40 mg by mouth daily at 12 noon.  . gabapentin (NEURONTIN) 300 MG capsule Take 900 mg by mouth at bedtime.  Marland Kitchen HYDROcodone-acetaminophen (NORCO/VICODIN) 5-325  MG tablet Take 1-2 tablets by mouth every 6 (six) hours as needed for moderate pain.  Marland Kitchen levothyroxine (SYNTHROID, LEVOTHROID) 50 MCG tablet Take 50 mcg by mouth daily before breakfast.  . lovastatin (MEVACOR) 40 MG tablet Take 40 mg by mouth at bedtime.  . mometasone-formoterol (DULERA) 200-5 MCG/ACT AERO Inhale 2 puffs into the lungs daily.  . montelukast (SINGULAIR) 10 MG tablet Take 10 mg by mouth daily.  . predniSONE (DELTASONE) 1 MG tablet Take 1 tablet by mouth daily.  . SUMAtriptan (IMITREX) 50 MG tablet Take 1 tablet (50 mg total) by mouth every 2 (two) hours as needed for migraine. May repeat in 2 hours if headache persists or recurs.  . Tocilizumab 162 MG/0.9ML SOSY Inject 162 mg into the skin every 7 (seven) days.  Marland Kitchen topiramate (TOPAMAX) 100 MG tablet Take 1 tablet (100 mg total) by mouth 2 (two) times daily.  . Triamcinolone Acetonide (TRIAMCINOLONE 0.1 % CREAM : EUCERIN) CREA Apply  1 application topically as needed for itching.      Results for orders placed or performed during the hospital encounter of 11/01/16 (from the past 48 hour(s))  Urinalysis, Routine w reflex microscopic- may I&O cath if menses     Status: Abnormal   Collection Time: 11/01/16 11:54 PM  Result Value Ref Range   Color, Urine YELLOW YELLOW   APPearance HAZY (A) CLEAR   Specific Gravity, Urine 1.012 1.005 - 1.030   pH 6.0 5.0 - 8.0   Glucose, UA NEGATIVE NEGATIVE mg/dL   Hgb urine dipstick NEGATIVE NEGATIVE   Bilirubin Urine NEGATIVE NEGATIVE   Ketones, ur NEGATIVE NEGATIVE mg/dL   Protein, ur NEGATIVE NEGATIVE mg/dL   Nitrite NEGATIVE NEGATIVE   Leukocytes, UA TRACE (A) NEGATIVE   RBC / HPF 0-5 0 - 5 RBC/hpf   WBC, UA 0-5 0 - 5 WBC/hpf   Bacteria, UA RARE (A) NONE SEEN   Squamous Epithelial / LPF 0-5 (A) NONE SEEN   Mucus PRESENT   Comprehensive metabolic panel     Status: Abnormal   Collection Time: 11/02/16  4:59 AM  Result Value Ref Range   Sodium 146 (H) 135 - 145 mmol/L   Potassium 2.9 (L) 3.5 - 5.1 mmol/L   Chloride 115 (H) 101 - 111 mmol/L   CO2 24 22 - 32 mmol/L   Glucose, Bld 131 (H) 65 - 99 mg/dL   BUN 7 6 - 20 mg/dL   Creatinine, Ser 0.81 0.44 - 1.00 mg/dL   Calcium 9.3 8.9 - 10.3 mg/dL   Total Protein 5.6 (L) 6.5 - 8.1 g/dL   Albumin 3.7 3.5 - 5.0 g/dL   AST 23 15 - 41 U/L   ALT 24 14 - 54 U/L   Alkaline Phosphatase 35 (L) 38 - 126 U/L   Total Bilirubin 0.7 0.3 - 1.2 mg/dL   GFR calc non Af Amer >60 >60 mL/min   GFR calc Af Amer >60 >60 mL/min    Comment: (NOTE) The eGFR has been calculated using the CKD EPI equation. This calculation has not been validated in all clinical situations. eGFR's persistently <60 mL/min signify possible Chronic Kidney Disease.    Anion gap 7 5 - 15  CBC with Differential     Status: Abnormal   Collection Time: 11/02/16  4:59 AM  Result Value Ref Range   WBC 10.9 (H) 4.0 - 10.5 K/uL   RBC 4.69 3.87 - 5.11 MIL/uL   Hemoglobin  14.1 12.0 - 15.0 g/dL  HCT 44.2 36.0 - 46.0 %   MCV 94.2 78.0 - 100.0 fL   MCH 30.1 26.0 - 34.0 pg   MCHC 31.9 30.0 - 36.0 g/dL   RDW 15.2 11.5 - 15.5 %   Platelets 172 150 - 400 K/uL   Neutrophils Relative % 68 %   Neutro Abs 7.4 1.7 - 7.7 K/uL   Lymphocytes Relative 24 %   Lymphs Abs 2.6 0.7 - 4.0 K/uL   Monocytes Relative 7 %   Monocytes Absolute 0.8 0.1 - 1.0 K/uL   Eosinophils Relative 1 %   Eosinophils Absolute 0.1 0.0 - 0.7 K/uL   Basophils Relative 0 %   Basophils Absolute 0.0 0.0 - 0.1 K/uL  Lipase, blood     Status: None   Collection Time: 11/02/16  4:59 AM  Result Value Ref Range   Lipase 28 11 - 51 U/L   Ct Renal Stone Study  Result Date: 11/02/2016 CLINICAL DATA:  Left lower quadrant pain for 2 days. Frequent urination. EXAM: CT ABDOMEN AND PELVIS WITHOUT CONTRAST TECHNIQUE: Multidetector CT imaging of the abdomen and pelvis was performed following the standard protocol without IV contrast. COMPARISON:  None. FINDINGS: Lower chest: Linear scarring in the lung bases. Hepatobiliary: Surgical absence of the gallbladder. No bile duct dilatation. Multiple low-attenuation lesions throughout the liver. Largest measures about 2.7 cm in diameter. Lesions likely represent cysts although hemangiomas or cystic metastasis would not be excluded. Pancreas: Unremarkable. No pancreatic ductal dilatation or surrounding inflammatory changes. Spleen: Normal in size without focal abnormality. Adrenals/Urinary Tract: No adrenal gland nodules. Kidneys are symmetrical. No hydronephrosis or hydroureter. No renal, ureteral, or bladder stones. Bladder wall is not thickened. Stomach/Bowel: Stomach is unremarkable. Small bowel are decompressed. Scattered stool throughout the colon without distention. Diverticulosis of the sigmoid colon with inflammatory infiltration and pericolonic gas consistent with acute diverticulitis. No abscess. There is diffuse free air throughout the abdomen and pelvis, likely due  to perforation from the diverticulitis. Appendix is surgically absent. Vascular/Lymphatic: No significant vascular findings are present. No enlarged abdominal or pelvic lymph nodes. Reproductive: Uterus and bilateral adnexa are unremarkable. Other: No free fluid in the abdomen. Abdominal wall musculature appears intact. Musculoskeletal: No acute or significant osseous findings. IMPRESSION: 1. Perforated diverticulitis of the sigmoid colon with free air throughout the abdomen and pelvis. No abscess. 2. Multiple low-attenuation liver lesions, likely representing cysts. Consider follow-up with elective contrast-enhanced study for better characterization. 3. Surgical absence of the gallbladder. These results were called by telephone at the time of interpretation on 11/02/2016 at 4:42 am to Dr. Delora Fuel , who verbally acknowledged these results. Electronically Signed   By: Lucienne Capers M.D.   On: 11/02/2016 04:44    Review of Systems  Constitutional: Negative.   HENT: Negative.   Eyes: Negative.   Respiratory: Negative.   Cardiovascular: Negative.   Gastrointestinal: Positive for abdominal pain, constipation and nausea. Negative for diarrhea.  Genitourinary: Negative.   Musculoskeletal:       Recent right foot surgery   Skin: Negative.   Neurological: Negative.   Endo/Heme/Allergies: Negative.   Psychiatric/Behavioral: Negative.   All other systems reviewed and are negative.   Blood pressure (!) 116/57, pulse 82, temperature 98.5 F (36.9 C), temperature source Oral, resp. rate 18, SpO2 98 %. Physical Exam  Constitutional: She is oriented to person, place, and time. She appears well-developed and well-nourished. She appears distressed (looks uncomfortable).  HENT:  Head: Normocephalic and atraumatic.  Right Ear: External ear normal.  Left Ear: External ear normal.  Mouth/Throat: Oropharynx is clear and moist.  Eyes: Pupils are equal, round, and reactive to light. Conjunctivae are  normal. Right eye exhibits no discharge. Left eye exhibits no discharge. No scleral icterus.  Neck: Normal range of motion. Neck supple. No tracheal deviation present. No thyromegaly present.  Cardiovascular: Normal rate, regular rhythm, normal heart sounds and intact distal pulses.  Exam reveals no gallop and no friction rub.   No murmur heard. Respiratory: Effort normal and breath sounds normal. No respiratory distress. She exhibits no tenderness.  GI: Soft. She exhibits distension (mild). She exhibits no mass. There is tenderness (significant LLQ, suprapubic tenderness, tndernss on left with right pressure). There is guarding. There is no rebound.  Hypoactive bowel sounds  Musculoskeletal: Normal range of motion. She exhibits deformity (surgical shoe on right foot.). She exhibits no edema or tenderness.  Lymphadenopathy:    She has no cervical adenopathy.  Neurological: She is alert and oriented to person, place, and time. Coordination normal.  Skin: Skin is warm and dry. No rash noted. She is not diaphoretic. No erythema. No pallor.  Psychiatric: She has a normal mood and affect. Her behavior is normal. Judgment and thought content normal.     Assessment/Plan Perforated sigmoid diverticulitis with free air throughout abdomen Giant cell arteritis Recent morton's neuroma surgery.   Asthma Migraine  NPO IVF IV antibiotics OR for sigmoid colectomy later today.   Will d/w Dr. Harlow Asa. May need stress dose steroids. Continue home meds/inhalers for asthma Imitrex PRN  Noor Vidales, MD 11/02/2016, 6:22 AM

## 2016-11-02 NOTE — Transfer of Care (Signed)
Immediate Anesthesia Transfer of Care Note  Patient: Erica Caldwell  Procedure(s) Performed: EXPLORATORY LAPAROTOMY, SIGMOID COLECTOMY, COLOSTOMY (N/A )  Patient Location: PACU  Anesthesia Type:General  Level of Consciousness: sedated  Airway & Oxygen Therapy: Patient Spontanous Breathing and Patient connected to face mask oxygen  Post-op Assessment: Report given to RN and Post -op Vital signs reviewed and stable  Post vital signs: Reviewed and stable  Last Vitals:  Vitals:   11/02/16 0800 11/02/16 0947  BP: 113/61 (!) 99/47  Pulse: 95 88  Resp:  17  Temp:    SpO2: 94% 96%    Last Pain:  Vitals:   11/02/16 0621  TempSrc:   PainSc: 2       Patients Stated Pain Goal: 4 (11/02/16 1149)  Complications: No apparent anesthesia complications

## 2016-11-02 NOTE — Anesthesia Preprocedure Evaluation (Signed)
Anesthesia Evaluation  Patient identified by MRN, date of birth, ID band Patient awake    Reviewed: Allergy & Precautions, NPO status , Patient's Chart, lab work & pertinent test results  Airway Mallampati: II  TM Distance: >3 FB Neck ROM: Full    Dental no notable dental hx.    Pulmonary neg pulmonary ROS, asthma ,    Pulmonary exam normal breath sounds clear to auscultation       Cardiovascular negative cardio ROS Normal cardiovascular exam Rhythm:Regular Rate:Normal     Neuro/Psych  Headaches, negative neurological ROS  negative psych ROS   GI/Hepatic negative GI ROS, Neg liver ROS,   Endo/Other  negative endocrine ROS  Renal/GU negative Renal ROS  negative genitourinary   Musculoskeletal negative musculoskeletal ROS (+) Arthritis , Osteoarthritis,  Fibromyalgia -  Abdominal (+) + obese,   Peds negative pediatric ROS (+)  Hematology negative hematology ROS (+)   Anesthesia Other Findings Perforated bowel  Reproductive/Obstetrics negative OB ROS                             Anesthesia Physical Anesthesia Plan  ASA: III  Anesthesia Plan: General   Post-op Pain Management:    Induction: Intravenous  PONV Risk Score and Plan: 3 and Ondansetron, Dexamethasone and Midazolam  Airway Management Planned: Oral ETT  Additional Equipment: None  Intra-op Plan:   Post-operative Plan: Extubation in OR and Possible Post-op intubation/ventilation  Informed Consent: I have reviewed the patients History and Physical, chart, labs and discussed the procedure including the risks, benefits and alternatives for the proposed anesthesia with the patient or authorized representative who has indicated his/her understanding and acceptance.   Dental advisory given  Plan Discussed with: CRNA  Anesthesia Plan Comments:         Anesthesia Quick Evaluation

## 2016-11-02 NOTE — ED Notes (Signed)
Patient transported to CT 

## 2016-11-02 NOTE — ED Notes (Signed)
Spoke with EDP about patient and condition, no new orders at this time.

## 2016-11-02 NOTE — ED Provider Notes (Signed)
[facility]-EMERGENCY DEPT Provider Note   CSN: [id number] Arrival date & time: 11/01/16  2326     History   Chief Complaint Chief Complaint  Patient presents with  . Groin Pain    HPI Erica Caldwell is a 61 y.o. female.  The history is provided by the patient.  She had onset this evening of pain in the left groin area. Pain is severe and she rates it at 10/10. It is worse with movement, better if she sits still. There has been some mild nausea but no vomiting. She denies fever or chills. She denies urinary urgency, frequency, tenesmus, dysuria. She has not taken anything for pain. She's never had anything like this before.  Past Medical History:  Diagnosis Date  . Arthritis   . Asthma   . Fibromyalgia   . Headache   . Hyperlipidemia   . Low back pain   . Pre-diabetes   . Temporal arteritis [facility])     Patient Active Problem List   Diagnosis Date Noted  . Chronic migraine 08/29/2016  . Obesity 09/24/2014  . Mild persistent asthma 08/12/2014    Past Surgical History:  Procedure Laterality Date  . ANKLE SURGERY Right   . APPENDECTOMY    . CARPAL TUNNEL RELEASE Left   . HERNIA REPAIR  04/01/14  . ROTATOR CUFF REPAIR Left    x 2  . TEMPORAL ARTERY BIOPSY / LIGATION    . WRIST SURGERY Left     OB History    No data available       Home Medications    Prior to Admission medications   Medication Sig Start Date End Date Taking? Authorizing Provider  acetaminophen (TYLENOL) 325 MG tablet Take 650 mg by mouth every 6 (six) hours as needed for mild pain.    Yes [provider]  albuterol (PROAIR HFA) 108 (90 BASE) MCG/ACT inhaler Inhale 2 puffs into the lungs every 6 (six) hours as needed for wheezing or shortness of breath.   Yes [provider]  docusate sodium (COLACE) 100 MG capsule Take 100 mg by mouth daily as needed for mild constipation.   Yes [provider]  DULoxetine (CYMBALTA) 30 MG capsule Take 30 mg by mouth daily.   Yes [provider]  esomeprazole (NEXIUM) 40 MG capsule Take 40 mg by mouth daily at 12 noon.   Yes [provider]  gabapentin (NEURONTIN) 300 MG capsule Take 900 mg by mouth at bedtime.   Yes [provider]  HYDROcodone-acetaminophen (NORCO/VICODIN) 5-325 MG tablet Take 1-2 tablets by mouth every 6 (six) hours as needed for moderate pain.   Yes [provider]  levothyroxine (SYNTHROID, LEVOTHROID) 50 MCG tablet Take 50 mcg by mouth daily before breakfast.   Yes [provider]  lovastatin (MEVACOR) 40 MG tablet Take 40 mg by mouth at bedtime.   Yes [provider]  mometasone-formoterol (DULERA) 200-5 MCG/ACT AERO Inhale 2 puffs into the lungs daily.   Yes [provider]  montelukast (SINGULAIR) 10 MG tablet Take 10 mg by mouth daily. 09/13/16  Yes [provider]  predniSONE (DELTASONE) 1 MG tablet Take 1 tablet by mouth daily.   Yes [provider]  SUMAtriptan (IMITREX) 50 MG tablet Take 1 tablet (50 mg total) by mouth every 2 (two) hours as needed for migraine. May repeat in 2 hours if headache persists or recurs. 08/29/16  Yes Levert Feinstein, MD  Tocilizumab 162 MG/0.9ML SOSY Inject 162 mg into the skin  every 7 (seven) days.   Yes [provider]  topiramate (TOPAMAX) 100 MG tablet Take 1 tablet (100 mg total) by mouth 2 (two) times daily. 08/29/16  Yes Levert Feinstein, MD  Triamcinolone Acetonide (TRIAMCINOLONE 0.1 % CREAM : EUCERIN) CREA Apply 1 application topically as needed for itching.    Yes [provider]    Family History Family History  Problem Relation Age of Onset  . Allergies Brother   . Irritable bowel syndrome Brother   . Asthma Sister   . Irritable bowel syndrome Sister   . Heart disease Mother   . Arthritis Mother   . Stroke Mother   . Heart attack Mother   . Heart disease Father   . Colon cancer Father   . Stroke Father   . Heart attack Father   . Other Father        Guillain-Barre     Social History Social History  Substance Use Topics  . Smoking status: Never Smoker  . Smokeless tobacco: Never Used  . Alcohol use No     Allergies   Levaquin  [levofloxacin in d5w]; Other; Latex; Penicillins; Silver; Cefazolin; Chloraprep one step [chlorhexidine gluconate]; and Tape   Review of Systems Review of Systems  All other systems reviewed and are negative.    Physical Exam Updated Vital Signs BP 131/62 (BP Location: Left Arm)   Pulse 93   Temp 98.5 F (36.9 C) (Oral)   Resp 18   SpO2 97%   Physical Exam  Nursing note and vitals reviewed.  61 year old female, resting comfortably and in no acute distress. Vital signs are normal. Oxygen saturation is 97%, which is normal. Head is normocephalic and atraumatic. PERRLA, EOMI. Oropharynx is clear. Neck is nontender and supple without adenopathy or JVD. Back is nontender in the midline. There is mild left CVA tenderness. Lungs are clear without rales, wheezes, or rhonchi. Chest is nontender. Heart has regular rate and rhythm without murmur. Abdomen is soft, flat, with marked left lower quadrant tenderness. There is no rebound or guarding. There are no masses or hepatosplenomegaly and peristalsis is hypoactive. Extremities have no cyanosis or edema, full range of motion is present. Surgical dressing is present on the right foot and is not removed. Skin is warm and dry without rash. Neurologic: Mental status is normal, cranial nerves are intact, there are no motor or sensory deficits.  ED Treatments / Results  Labs (all labs ordered are listed, but only abnormal results are displayed) Labs Reviewed  URINALYSIS, ROUTINE W REFLEX MICROSCOPIC - Abnormal; Notable for the following:       Result Value   APPearance HAZY (*)    Leukocytes, UA TRACE (*)    Bacteria, UA RARE (*)    Squamous Epithelial / LPF 0-5 (*)    All other components within normal limits  COMPREHENSIVE METABOLIC PANEL - Abnormal; Notable for  the following:    Sodium 146 (*)    Potassium 2.9 (*)    Chloride 115 (*)    Glucose, Bld 131 (*)    Total Protein 5.6 (*)    Alkaline Phosphatase 35 (*)    All other components within normal limits  CBC WITH DIFFERENTIAL/PLATELET - Abnormal; Notable for the following:    WBC 10.9 (*)    All other components within normal limits  LIPASE, BLOOD    Radiology Ct Renal Stone Study  Result Date: 11/02/2016 CLINICAL DATA:  Left lower quadrant pain for 2 days. Frequent urination.  EXAM: CT ABDOMEN AND PELVIS WITHOUT CONTRAST TECHNIQUE: Multidetector CT imaging of the abdomen and pelvis was performed following the standard protocol without IV contrast. COMPARISON:  None. FINDINGS: Lower chest: Linear scarring in the lung bases. Hepatobiliary: Surgical absence of the gallbladder. No bile duct dilatation. Multiple low-attenuation lesions throughout the liver. Largest measures about 2.7 cm in diameter. Lesions likely represent cysts although hemangiomas or cystic metastasis would not be excluded. Pancreas: Unremarkable. No pancreatic ductal dilatation or surrounding inflammatory changes. Spleen: Normal in size without focal abnormality. Adrenals/Urinary Tract: No adrenal gland nodules. Kidneys are symmetrical. No hydronephrosis or hydroureter. No renal, ureteral, or bladder stones. Bladder wall is not thickened. Stomach/Bowel: Stomach is unremarkable. Small bowel are decompressed. Scattered stool throughout the colon without distention. Diverticulosis of the sigmoid colon with inflammatory infiltration and pericolonic gas consistent with acute diverticulitis. No abscess. There is diffuse free air throughout the abdomen and pelvis, likely due to perforation from the diverticulitis. Appendix is surgically absent. Vascular/Lymphatic: No significant vascular findings are present. No enlarged abdominal or pelvic lymph nodes. Reproductive: Uterus and bilateral adnexa are unremarkable. Other: No free fluid in the  abdomen. Abdominal wall musculature appears intact. Musculoskeletal: No acute or significant osseous findings. IMPRESSION: 1. Perforated diverticulitis of the sigmoid colon with free air throughout the abdomen and pelvis. No abscess. 2. Multiple low-attenuation liver lesions, likely representing cysts. Consider follow-up with elective contrast-enhanced study for better characterization. 3. Surgical absence of the gallbladder. These results were called by telephone at the time of interpretation on 11/02/2016 at 4:42 am to Dr. Dione Booze , who verbally acknowledged these results. Electronically Signed   By: Burman Nieves M.D.   On: 11/02/2016 04:44    Procedures Procedures (including critical care time) CRITICAL CARE Performed by: ZOXWR,UEAVW Total critical care time: 50 minutes Critical care time was exclusive of separately billable procedures and treating other patients. Critical care was necessary to treat or prevent imminent or life-threatening deterioration. Critical care was time spent personally by me on the following activities: development of treatment plan with patient and/or surrogate as well as nursing, discussions with consultants, evaluation of patient's response to treatment, examination of patient, obtaining history from patient or surrogate, ordering and performing treatments and interventions, ordering and review of laboratory studies, ordering and review of radiographic studies, pulse oximetry and re-evaluation of patient's condition.  Medications Ordered in ED Medications  oxyCODONE-acetaminophen (PERCOCET/ROXICET) 5-325 MG per tablet 1 tablet (1 tablet Oral Given 11/02/16 0346)  meropenem (MERREM) 1 g in sodium chloride 0.9 % 100 mL IVPB (0 g Intravenous Stopped 11/02/16 0715)  metroNIDAZOLE (FLAGYL) IVPB 500 mg (0 mg Intravenous Stopped 11/02/16 0615)  potassium chloride 10 mEq in 100 mL IVPB (not administered)  potassium chloride 10 mEq in 100 mL IVPB (not administered)   sodium chloride 0.9 % bolus 1,000 mL (0 mLs Intravenous Stopped 11/02/16 0600)  morphine 4 MG/ML injection 4 mg (4 mg Intravenous Given 11/02/16 0515)  ondansetron (ZOFRAN) injection 4 mg (4 mg Intravenous Given 11/02/16 0515)     Initial Impression / Assessment and Plan / ED Course  I have reviewed the triage vital signs and the nursing notes.  Pertinent labs & imaging results that were available during my care of the patient were reviewed by me and considered in my medical decision making (see chart for details). CT was discussed by me,  Personally, with radiologist.  Left lower quadrant pain of uncertain cause. Consider renal colic, diverticulitis, ovarian cyst. Old records are reviewed, and she has  no relevant past visits. She did have resection of neuroma of the right foot on September 20.  CT scan shows pneumoperitoneum with source apparently being diverticulitis in the sigmoid colon. She is allergic to cephalosporins, penicillins, and fluoroquinolones. She started on meropenem and metronidazole. Case is discussed with Dr. Donell Beers of general surgery service who agrees to admit the patient. Laboratory workup shows moderate to severe hypokalemia. She's given intravenous potassium.  Final Clinical Impressions(s) / ED Diagnoses   Final diagnoses:  Pneumoperitoneum  Diverticulitis of large intestine with perforation without bleeding  Hypokalemia    New Prescriptions New Prescriptions   No medications on file     Dione Booze, MD 11/02/16 [phone]

## 2016-11-02 NOTE — Anesthesia Postprocedure Evaluation (Signed)
Anesthesia Post Note  [name]  Procedure(s) Performed: EXPLORATORY LAPAROTOMY, SIGMOID COLECTOMY, COLOSTOMY (N/A )     Patient location during evaluation: PACU Anesthesia Type: General Level of consciousness: awake and alert Pain management: pain level controlled Vital Signs Assessment: post-procedure vital signs reviewed and stable Respiratory status: spontaneous breathing, nonlabored ventilation and respiratory function stable Cardiovascular status: blood pressure returned to baseline and stable Postop Assessment: no apparent nausea or vomiting Anesthetic complications: no    Last Vitals:  Vitals:   11/02/16 1515 11/02/16 1530  BP: 129/70 137/83  Pulse: 87 93  Resp: 17 (!) 26  Temp: (!) 36.3 C 36.4 C  SpO2: 95% 91%    Last Pain:  Vitals:   11/02/16 1530  TempSrc: Oral  PainSc:                  [name]

## 2016-11-02 NOTE — ED Notes (Signed)
Pt complaining of LLQ pain that radiated across abdomen. Pain is constant and not relieved by anything and worsened by movement. Pain radiates across lower abdomen. Patient endorses frequent urination and an "odd" sensation and pain in the LLQ when she urinates or has a bowel movement. Patient states BMs are normal and it hurts even though she is not straining. No blood noted in stool or urine.

## 2016-11-02 NOTE — ED Notes (Signed)
Bed: WA24 Expected date:  Expected time:  Means of arrival:  Comments: Rm 8 

## 2016-11-02 NOTE — Progress Notes (Signed)
Patient ID: Erica Caldwell, female   DOB: 1955/04/14, 61 y.o.   MRN: [id number]  General Surgery [facility]  Patient seen and examined in the ER.  Perforated diverticulitis with free air and developing peritonitis.  Plan ex lap with Hartmann's resection later today.  Admit to stepdown unit, IV abx, IV hydration.  Orders entered.  The risks and benefits of the procedure have been discussed at length with the patient.  The patient understands the proposed procedure, potential alternative treatments, and the course of recovery to be expected.  All of the patient's questions have been answered at this time.  The patient wishes to proceed with surgery.  Darnell Level, MD [facility] Surgery Office: [phone]

## 2016-11-02 NOTE — Op Note (Signed)
Erica Caldwell, KELLENBERGER NO.:  [id number]  MEDICAL RECORD NO.:  [id number]  LOCATION:  WA24                         FACILITY:  [facility]  PHYSICIAN:  Velora Heckler, MD      DATE OF BIRTH:  09-Jul-1955  DATE OF PROCEDURE:  11/02/2016                              OPERATIVE REPORT   PREOPERATIVE DIAGNOSIS:  Perforated sigmoid diverticulitis.  POSTOPERATIVE DIAGNOSIS:  Perforated sigmoid diverticulitis.  PROCEDURE:  Exploratory laparotomy with sigmoid colectomy and descending colostomy.  SURGEON:  Velora Heckler, MD.  ANESTHESIA:  General.  ESTIMATED BLOOD LOSS:  Minimal.  PREPARATION:  Betadine.  COMPLICATIONS:  None.  INDICATIONS:  The patient is a 61 year old female, who presents to the emergency department with 3-day history of abdominal pain in the left lower quadrant.  The pain became more severe, more generalized. Evaluation in the emergency department showed a mildly elevated white blood cell count.  Abdominal tenderness was appreciated on exam.  CT scan of the abdomen and pelvis showed findings consistent with perforated sigmoid diverticulitis with free intraperitoneal air.  The patient was prepared and brought to the operating room for urgent laparotomy.  BODY OF REPORT:  Procedure was done in OR #4 at the [facility].  The patient was brought to the operating room, placed in supine position on the operating room table.  Following administration of general anesthesia, the patient was positioned and then prepped and draped in the usual aseptic fashion.  After ascertaining that an adequate level of anesthesia had been achieved, midline abdominal incision was made with #10 blade.  Dissection was carried through subcutaneous tissues.  Fascia was incised in the midline and the peritoneal cavity was entered cautiously.  The patient has had a previous mesh hernia repair.  At the level of the umbilicus and just above the umbilicus in the  midline.  Mesh was divided.  There were omental adhesions to the mesh, which were partially taken down. Hemostasis was achieved with the electrocautery.  Abdominal exploration shows inflammatory changes in the distal sigmoid colon with a site of perforation.  Disease is relatively localized. There was no gross spillage of fecal material.  Balfour retractor was placed for exposure.  Sigmoid colon was mobilized.  Approximately 10 cm proximal to the perforation, the bowel was transected with a GIA stapler.  Using the enseal, the mesentery was divided and dissection was carried distally to the pelvic peritoneal reflection.  Mesentery was cleared from the posterior wall of the colon.  Using a contour stapler, the colon was transected at the pelvic brim.  The specimen was marked with a suture distally.  It was submitted to pathology, labeled sigmoid colon.  Staple line at the proximal rectum was marked with Prolene sutures on each end.  Abdomen was then copiously irrigated with warm saline, which was evacuated.  Descending colon was then mobilized slightly from its lateral attachments.  A point on the left anterior abdominal wall was selected and an ellipse of skin was removed.  A plug of adipose tissue was removed.  Abdominal wall was incised in a cruciate fashion and the muscle was split and the peritoneal cavity was entered cautiously. Using a Babcock clamp, the  distal descending colon was brought through the abdominal wall.  Bowel was secured to the fascia with interrupted 2- 0 silk sutures.  Next, the midline fascia was closed with a single layer of interrupted #1 Novafil sutures.  Subcutaneous tissues were irrigated.  Colostomy was matured by excising the staple line.  Edges of the bowel were matured to the skin circumferentially with interrupted 3-0 Vicryl sutures.  Betadine-soaked sponges were placed in the midline wound in the subcutaneous space.  Dry gauze dressings were  applied.  Colostomy appliance was applied.  The patient was awakened from anesthesia and brought to the recovery room.  The patient will be admitted to the step- down unit for observation.  The patient tolerated the procedure well.   Darnell Level, MD [facility] Surgery Office: [phone]    TMG/MEDQ  D:  11/02/2016  T:  11/02/2016  Job:  [id number]  cc:   Dr. Kem Kays Office

## 2016-11-02 NOTE — ED Notes (Signed)
Byerly MD at bedside to speak with patient

## 2016-11-02 NOTE — Anesthesia Procedure Notes (Signed)
Procedure Name: Intubation Date/Time: 11/02/2016 12:27 PM Performed by: Lind Covert Pre-anesthesia Checklist: Patient identified, Emergency Drugs available, Suction available, Patient being monitored and Timeout performed Patient Re-evaluated:Patient Re-evaluated prior to induction Oxygen Delivery Method: Circle system utilized Preoxygenation: Pre-oxygenation with 100% oxygen Induction Type: IV induction Laryngoscope Size: Mac and 3 Grade View: Grade I Tube type: Oral Tube size: 7.0 mm Number of attempts: 1 Airway Equipment and Method: Stylet Placement Confirmation: ETT inserted through vocal cords under direct vision,  positive ETCO2 and breath sounds checked- equal and bilateral Secured at: 22 cm Tube secured with: Tape Dental Injury: Teeth and Oropharynx as per pre-operative assessment

## 2016-11-02 NOTE — Consult Note (Addendum)
WOC Nurse ostomy consult note  WOC Nurse requested for preoperative stoma site marking by Dr. Nat Christen.  A LLQ ostomy site marking is requested for a possible Hartmann's resection later today.  Discussed surgical procedure and stoma creation with patient.  Explained role of the WOC nurse team.  Answered patient questions. Patient reports that her husband will be back this afternoon prior to the procedure, that they were both up all night and he has returned hoe for a little rest.  Examined patient lying and sitting on the stretcher in the ED in order to place the marking in the patient's visual field, away from any creases or abdominal contour issues and within the rectus muscle.    Marked for colostomy in the LLQ  6cm to the left of the umbilicus and 4cm below the umbilicus.  Marked for ileostomy in the RUQ  6cm to the right of the umbilicus and  6.6 cm above the umbilicus.  Patient's abdomen cleansed with CHG wipes at site markings, allowed to air dry prior to marking.Covered marks with thin film transparent dressing to preserve mark until time of surgery later today.   WOC nursing team will not follow, but will remain available to this patient, the nursing, surgical and medical teams.  Please re-consult if an ostomy is created intraoperatively. Thanks, Ladona Mow, MSN, RN, GNP, [place]  Pager# [phone]

## 2016-11-02 NOTE — Progress Notes (Signed)
PHARMACY NOTE -  Meropenem  Pharmacy has been assisting with dosing of meropenem for perforated diverticulitis. Dosage remains stable at 1g IV q8 hr and need for further dosage adjustment appears unlikely at present.   Will sign off at this time.  Please reconsult if a change in clinical status warrants re-evaluation of dosage.  Bernadene Person, PharmD, BCPS Pager: [phone] 11/02/2016, 4:25 PM

## 2016-11-03 LAB — CBC
HCT: 43.5 % (ref 36.0–46.0)
Hemoglobin: 14.1 g/dL (ref 12.0–15.0)
MCH: 30.1 pg (ref 26.0–34.0)
MCHC: 32.4 g/dL (ref 30.0–36.0)
MCV: 92.9 fL (ref 78.0–100.0)
PLATELETS: 193 10*3/uL (ref 150–400)
RBC: 4.68 MIL/uL (ref 3.87–5.11)
RDW: 15.1 % (ref 11.5–15.5)
WBC: 16.2 10*3/uL — AB (ref 4.0–10.5)

## 2016-11-03 LAB — BASIC METABOLIC PANEL
Anion gap: 6 (ref 5–15)
CALCIUM: 8.1 mg/dL — AB (ref 8.9–10.3)
CO2: 22 mmol/L (ref 22–32)
CREATININE: 0.72 mg/dL (ref 0.44–1.00)
Chloride: 116 mmol/L — ABNORMAL HIGH (ref 101–111)
GFR calc non Af Amer: >60 mL/min (ref >60)
Glucose, Bld: 193 mg/dL — ABNORMAL HIGH (ref 65–99)
Potassium: 3.7 mmol/L (ref 3.5–5.1)
SODIUM: 144 mmol/L (ref 135–145)

## 2016-11-03 LAB — HIV ANTIBODY (ROUTINE TESTING W REFLEX): HIV SCREEN 4TH GENERATION: NONREACTIVE

## 2016-11-03 MED ORDER — CALCIUM CARBONATE ANTACID 500 MG PO CHEW
400.0000 mg | CHEWABLE_TABLET | Freq: Once | ORAL | Status: AC | PRN
  Administered 2016-11-03: 200 mg via ORAL
  Filled 2016-11-03: qty 2

## 2016-11-03 MED ORDER — CALCIUM CARBONATE ANTACID 500 MG PO CHEW
1.0000 | CHEWABLE_TABLET | ORAL | Status: DC | PRN
  Administered 2016-11-03: 200 mg via ORAL
  Filled 2016-11-03: qty 1

## 2016-11-03 MED ORDER — DIPHENHYDRAMINE HCL 12.5 MG/5ML PO ELIX: 12.5000 mg | ORAL_SOLUTION | Freq: Four times a day (QID) | ORAL | Status: DC | PRN

## 2016-11-03 MED ORDER — NALOXONE HCL 0.4 MG/ML IJ SOLN: 0.4000 mg | INTRAMUSCULAR | Status: DC | PRN

## 2016-11-03 MED ORDER — SODIUM CHLORIDE 0.9% FLUSH: 9.0000 mL | INTRAVENOUS | Status: DC | PRN

## 2016-11-03 MED ORDER — ONDANSETRON HCL 4 MG/2ML IJ SOLN: 4.0000 mg | Freq: Four times a day (QID) | INTRAMUSCULAR | Status: DC | PRN

## 2016-11-03 MED ORDER — DIPHENHYDRAMINE HCL 50 MG/ML IJ SOLN: 12.5000 mg | Freq: Four times a day (QID) | INTRAMUSCULAR | Status: DC | PRN

## 2016-11-03 MED ORDER — HYDROMORPHONE 1 MG/ML IV SOLN: INTRAVENOUS | Status: DC

## 2016-11-03 MED ORDER — SODIUM CHLORIDE 0.9 % IV SOLN
1.0000 g | Freq: Three times a day (TID) | INTRAVENOUS | Status: DC
  Administered 2016-11-04 – 2016-11-08 (×14): 1 g via INTRAVENOUS
  Filled 2016-11-03 (×15): qty 1

## 2016-11-03 NOTE — Progress Notes (Signed)
[facility]/Trauma Progress Note  1 Day Post-Op   Assessment/Plan  Giant cell arteritis - home  prednisone Recent morton's neuroma surgery - Dr. Zenda Alpers [phone], had an appointment 10/4 to remove stitches   Asthma - nebs PRN Migraine Hypothyroidism - home synthroid  Perforated sigmoid diverticulitis with free air throughout abdomen - S/P Hartman's, Exploratory laparotomy with sigmoid colectomy and descending colostomy, Dr. Gerrit Friends, 10/03  FEN: NPO, IVF, tums, protonix VTE: SCD's, lovenox ID: meropenum 10/03>> Foley: discontinued as pt has a latex allergy Pain: dilaudid PCA, Robaxin, home gabapentin Follow up: TBD  DISPO: await return of bowel function, move to floor, will contact Dr. Zenda Alpers regarding stitches in foot    LOS: 1 day    Subjective:  CC: abdominal pain, heartburn  Pt is having heartburn this am. She has a hx of intermittent heartburn. No nausea or vomiting. Had an appointment today with Dr. Zenda Alpers to remove stitches in her foot. Pain well controlled with PCA  Objective: Vital signs in last 24 hours: Temp:  [97.4 F (36.3 C)-99.7 F (37.6 C)] 99.7 F (37.6 C) (10/04 0300) Pulse Rate:  [84-99] 90 (10/04 0400) Resp:  [12-26] 12 (10/04 0400) BP: (99-146)/(47-86) 129/65 (10/04 0400) SpO2:  [91 %-100 %] 96 % (10/04 0400) Weight:  [172 lb (78 kg)-172 lb 9.9 oz (78.3 kg)] 172 lb 9.9 oz (78.3 kg) (10/03 1530)    Intake/Output from previous day: 10/03 0701 - 10/04 0700 In: 3964.6 [I.V.:3114.6; IV Piggyback:400] Out: 2400 [Urine:2300; Blood:100] Intake/Output this shift: No intake/output data recorded.  PE: Gen:  Alert, NAD, pleasant, cooperative Card:  RRR, no M/G/R heard, 2 + DP pulses bilaterally Pulm:  CTA, no W/R/R, effort normal Abd: Soft, not distended, hypoactive BS, midline incision appears well without purulent drainage  Skin: no rashes noted, warm and dry Extremities: right foot with stitches on the dorsal aspect,  wound appears well healing without surrounding erythema or drainage   Anti-infectives: Anti-infectives    Start     Dose/Rate Route Frequency Ordered Stop   11/02/16 0500  meropenem (MERREM) 1 g in sodium chloride 0.9 % 100 mL IVPB     1 g 200 mL/hr over 30 Minutes Intravenous Every 8 hours 11/02/16 0447     11/02/16 0500  metroNIDAZOLE (FLAGYL) IVPB 500 mg  Status:  Discontinued     500 mg 100 mL/hr over 60 Minutes Intravenous Every 8 hours 11/02/16 0456 11/02/16 1656      Lab Results:   Recent Labs  11/02/16 0459 11/03/16 0328  WBC 10.9* 16.2*  HGB 14.1 14.1  HCT 44.2 43.5  PLT 172 193   BMET  Recent Labs  11/02/16 0459 11/03/16 0328  NA 146* 144  K 2.9* 3.7  CL 115* 116*  CO2 24 22  GLUCOSE 131* 193*  BUN 7 <5*  CREATININE 0.81 0.72  CALCIUM 9.3 8.1*   PT/INR No results for input(s): LABPROT, INR in the last 72 hours. CMP     Component Value Date/Time   NA 144 11/03/2016 0328   K 3.7 11/03/2016 0328   CL 116 (H) 11/03/2016 0328   CO2 22 11/03/2016 0328   GLUCOSE 193 (H) 11/03/2016 0328   BUN <5 (L) 11/03/2016 0328   CREATININE 0.72 11/03/2016 0328   CALCIUM 8.1 (L) 11/03/2016 0328   PROT 5.6 (L) 11/02/2016 0459   ALBUMIN 3.7 11/02/2016 0459   AST 23 11/02/2016 0459   ALT 24 11/02/2016 0459   ALKPHOS 35 (L) 11/02/2016 0459   BILITOT  0.7 11/02/2016 0459   GFRNONAA >60 11/03/2016 0328   GFRAA >60 11/03/2016 0328   Lipase     Component Value Date/Time   LIPASE 28 11/02/2016 0459    Studies/Results: Ct Renal Stone Study  Result Date: 11/02/2016 CLINICAL DATA:  Left lower quadrant pain for 2 days. Frequent urination. EXAM: CT ABDOMEN AND PELVIS WITHOUT CONTRAST TECHNIQUE: Multidetector CT imaging of the abdomen and pelvis was performed following the standard protocol without IV contrast. COMPARISON:  None. FINDINGS: Lower chest: Linear scarring in the lung bases. Hepatobiliary: Surgical absence of the gallbladder. No bile duct dilatation. Multiple  low-attenuation lesions throughout the liver. Largest measures about 2.7 cm in diameter. Lesions likely represent cysts although hemangiomas or cystic metastasis would not be excluded. Pancreas: Unremarkable. No pancreatic ductal dilatation or surrounding inflammatory changes. Spleen: Normal in size without focal abnormality. Adrenals/Urinary Tract: No adrenal gland nodules. Kidneys are symmetrical. No hydronephrosis or hydroureter. No renal, ureteral, or bladder stones. Bladder wall is not thickened. Stomach/Bowel: Stomach is unremarkable. Small bowel are decompressed. Scattered stool throughout the colon without distention. Diverticulosis of the sigmoid colon with inflammatory infiltration and pericolonic gas consistent with acute diverticulitis. No abscess. There is diffuse free air throughout the abdomen and pelvis, likely due to perforation from the diverticulitis. Appendix is surgically absent. Vascular/Lymphatic: No significant vascular findings are present. No enlarged abdominal or pelvic lymph nodes. Reproductive: Uterus and bilateral adnexa are unremarkable. Other: No free fluid in the abdomen. Abdominal wall musculature appears intact. Musculoskeletal: No acute or significant osseous findings. IMPRESSION: 1. Perforated diverticulitis of the sigmoid colon with free air throughout the abdomen and pelvis. No abscess. 2. Multiple low-attenuation liver lesions, likely representing cysts. Consider follow-up with elective contrast-enhanced study for better characterization. 3. Surgical absence of the gallbladder. These results were called by telephone at the time of interpretation on 11/02/2016 at 4:42 am to Dr. Dione Booze , who verbally acknowledged these results. Electronically Signed   By: Burman Nieves M.D.   On: 11/02/2016 04:44      Jerre Simon , [facility] 11/03/2016, 8:17 AM Pager: [phone] Consults: [phone] Mon-Fri 7:00 am-4:30 pm Sat-Sun 7:00 am-11:30  am

## 2016-11-03 NOTE — Progress Notes (Signed)
Spoke with Dr. Aubery Lapping of [facility] regarding the pt's follow up appointment today with him that she missed. 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 Pt was to see Dr. Zenda Alpers today and have sutures removed. He states it is okay to remove sutures on 10/6 or 10/07 if the pt is still in the hospital. He requested that the area be cleaned first with chlorhexidine and steri-strips applied once sutures are removed. She should remain in her post-op shoe with WB to heel.   Mattie Marlin, [facility] Pager [phone]

## 2016-11-03 NOTE — Progress Notes (Signed)
Pt c/o heartburn. RN placed order for Tums per protocol (400 mg).

## 2016-11-03 NOTE — Care Management Note (Signed)
Case Management Note  Patient Details  Name: Erica Caldwell MRN: [id number] Date of Birth: 06-11-1955  Subjective/Objective:                  Pod 1 hartman's pouch  Action/Plan: Date:  November 03, 2016 Chart reviewed for concurrent status and case management needs.  Will continue to follow patient progress.  Discharge Planning: following for needs  Expected discharge date: [id number]  Marcelle Smiling, BSN, [place], [place]   [phone]   Expected Discharge Date:   (unknown)               Expected Discharge Plan:  Home/Self Care  In-House Referral:     Discharge planning Services  CM Consult  Post Acute Care Choice:    Choice offered to:     DME Arranged:    DME Agency:     HH Arranged:    HH Agency:     Status of Service:  In process, will continue to follow  If discussed at Long Length of Stay Meetings, dates discussed:    Additional Comments:  Golda Acre, RN 11/03/2016, 10:45 AM

## 2016-11-04 LAB — COMPREHENSIVE METABOLIC PANEL
ALBUMIN: 3.1 g/dL — AB (ref 3.5–5.0)
ALT: 20 U/L (ref 14–54)
ANION GAP: 6 (ref 5–15)
AST: 21 U/L (ref 15–41)
Alkaline Phosphatase: 29 U/L — ABNORMAL LOW (ref 38–126)
CHLORIDE: 117 mmol/L — AB (ref 101–111)
CO2: 21 mmol/L — ABNORMAL LOW (ref 22–32)
Calcium: 8.3 mg/dL — ABNORMAL LOW (ref 8.9–10.3)
Creatinine, Ser: 0.65 mg/dL (ref 0.44–1.00)
GFR calc Af Amer: >60 mL/min (ref >60)
GLUCOSE: 126 mg/dL — AB (ref 65–99)
POTASSIUM: 4 mmol/L (ref 3.5–5.1)
Sodium: 144 mmol/L (ref 135–145)
Total Bilirubin: 0.4 mg/dL (ref 0.3–1.2)
Total Protein: 4.9 g/dL — ABNORMAL LOW (ref 6.5–8.1)

## 2016-11-04 LAB — CBC
HCT: 42.6 % (ref 36.0–46.0)
Hemoglobin: 13.2 g/dL (ref 12.0–15.0)
MCH: 30 pg (ref 26.0–34.0)
MCHC: 31 g/dL (ref 30.0–36.0)
MCV: 96.8 fL (ref 78.0–100.0)
PLATELETS: 172 10*3/uL (ref 150–400)
RBC: 4.4 MIL/uL (ref 3.87–5.11)
RDW: 15.5 % (ref 11.5–15.5)
WBC: 9.1 10*3/uL (ref 4.0–10.5)

## 2016-11-04 MED ORDER — ACETAMINOPHEN 500 MG PO TABS
1000.0000 mg | ORAL_TABLET | Freq: Three times a day (TID) | ORAL | Status: DC
  Administered 2016-11-04 – 2016-11-08 (×13): 1000 mg via ORAL
  Filled 2016-11-04 (×13): qty 2

## 2016-11-04 NOTE — Consult Note (Signed)
WOC Nurse ostomy consult note Stoma type/location: LLQ Colostomy, located within a crease at 3 o'clock and most significant at 9 o'clock, near umbilicus.  Os is pointed downward at 6 o'clock.  Will add barrier ring Stomal assessment/size: Oval:  1.3 cm wide and 0.6 cm length, edematous pink and moist, blood tinged liquid in pouch Peristomal assessment: intact skin, creasing.  Treatment options for stomal/peristomal skin: Barrier ring in creases and along lower half.  Output liquid only at this time.  Ostomy pouching: 1pc. Convex pouch with barrier ring on lower half and in creasing.  Education provided: Pouch change performed.  Patient's sister lives with her and has a spouse with an ostomy and intends to help her.  Discussed rationale for barrier rings and convex pouch. Patient attentive but defers to her sister.  Sister asks appropriate questions and says pouching will not be difficult for her.  Teaching will be ongoing.  Enrolled patient in [employer] DC program: No WOC team will follow.  Maple Hudson RN BSN [facility] Pager [phone]

## 2016-11-04 NOTE — Progress Notes (Signed)
2 Days Post-Op    CC:  Abdominal pain  Subjective: She is in bed, has been up in chair in ICU, but nothing more.  Foley out this AM. Open wound looks good.  Some gas and sweat in ostomy.  On PCA.  Cymbalta is for her fibromyalgia.  Objective: Vital signs in last 24 hours: Temp:  [98.1 F (36.7 C)-99 F (37.2 C)] 98.4 F (36.9 C) (10/05 0453) Pulse Rate:  [68-95] 68 (10/05 0453) Resp:  [12-19] 19 (10/05 0643) BP: (103-125)/(58-74) 122/73 (10/05 0453) SpO2:  [93 %-100 %] 99 % (10/05 0643) FiO2 (%):  [95 %] 95 % (10/04 0839)  30 PO 1330 IV 3400 urine No stool in ostomy Afebrile, VSS Labs OK WBC down to 9.1    Intake/Output from previous day: 10/04 0701 - 10/05 0700 In: 1363.3 [P.O.:30; I.V.:1333.3] Out: 3400 [Urine:3400] Intake/Output this shift: No intake/output data recorded.  General appearance: alert, cooperative and no distress Resp: clear to auscultation bilaterally GI: soft sore, sites looks fine on wet to dry dressings.  + BS, some gas and sweat in ostomy bag.    Lab Results:   Recent Labs  11/03/16 0328 11/04/16 0440  WBC 16.2* 9.1  HGB 14.1 13.2  HCT 43.5 42.6  PLT 193 172    BMET  Recent Labs  11/03/16 0328 11/04/16 0440  NA 144 144  K 3.7 4.0  CL 116* 117*  CO2 22 21*  GLUCOSE 193* 126*  BUN <5* <5*  CREATININE 0.72 0.65  CALCIUM 8.1* 8.3*   PT/INR No results for input(s): LABPROT, INR in the last 72 hours.   Recent Labs Lab 11/02/16 0459 11/04/16 0440  AST 23 21  ALT 24 20  ALKPHOS 35* 29*  BILITOT 0.7 0.4  PROT 5.6* 4.9*  ALBUMIN 3.7 3.1*     Lipase     Component Value Date/Time   LIPASE 28 11/02/2016 0459     Prior to Admission medications   Medication Sig Start Date End Date Taking? Authorizing Provider  acetaminophen (TYLENOL) 325 MG tablet Take 650 mg by mouth every 6 (six) hours as needed for mild pain.    Yes [provider]  albuterol (PROAIR HFA) 108 (90 BASE) MCG/ACT inhaler Inhale 2 puffs into the  lungs every 6 (six) hours as needed for wheezing or shortness of breath.   Yes [provider]  docusate sodium (COLACE) 100 MG capsule Take 100 mg by mouth daily as needed for mild constipation.   Yes [provider]  DULoxetine (CYMBALTA) 30 MG capsule Take 30 mg by mouth daily.   Yes [provider]  esomeprazole (NEXIUM) 40 MG capsule Take 40 mg by mouth daily at 12 noon.   Yes [provider]  gabapentin (NEURONTIN) 300 MG capsule Take 900 mg by mouth at bedtime.   Yes [provider]  HYDROcodone-acetaminophen (NORCO/VICODIN) 5-325 MG tablet Take 1-2 tablets by mouth every 6 (six) hours as needed for moderate pain.   Yes [provider]  levothyroxine (SYNTHROID, LEVOTHROID) 50 MCG tablet Take 50 mcg by mouth daily before breakfast.   Yes [provider]  lovastatin (MEVACOR) 40 MG tablet Take 40 mg by mouth at bedtime.   Yes [provider]  mometasone-formoterol (DULERA) 200-5 MCG/ACT AERO Inhale 2 puffs into the lungs daily.   Yes [provider]  montelukast (SINGULAIR) 10 MG tablet Take 10 mg by mouth daily. 09/13/16  Yes [provider]  predniSONE (DELTASONE) 1 MG tablet Take  1 tablet by mouth daily.   Yes [provider]  SUMAtriptan (IMITREX) 50 MG tablet Take 1 tablet (50 mg total) by mouth every 2 (two) hours as needed for migraine. May repeat in 2 hours if headache persists or recurs. 08/29/16  Yes Levert Feinstein, MD  Tocilizumab 162 MG/0.9ML SOSY Inject 162 mg into the skin every 7 (seven) days.   Yes [provider]  topiramate (TOPAMAX) 100 MG tablet Take 1 tablet (100 mg total) by mouth 2 (two) times daily. 08/29/16  Yes Levert Feinstein, MD  Triamcinolone Acetonide (TRIAMCINOLONE 0.1 % CREAM : EUCERIN) CREA Apply 1 application topically as needed for itching.    Yes [provider]    Medications: . DULoxetine  30 mg Oral Daily  . enoxaparin (LOVENOX) injection  40 mg  Subcutaneous Q24H  . gabapentin  900 mg Oral QHS  . HYDROmorphone   Intravenous Q4H  . levothyroxine  50 mcg Oral QAC breakfast  . mometasone-formoterol  2 puff Inhalation Daily  . montelukast  10 mg Oral Daily  . pantoprazole (PROTONIX) IV  40 mg Intravenous QHS  . pravastatin  40 mg Oral q1800  . predniSONE  1 mg Oral Daily  . topiramate  100 mg Oral BID   . dextrose 5 % and 0.45 % NaCl with KCl 20 mEq/L 1,000 mL (11/04/16 0506)  . meropenem (MERREM) IV 1 g (11/04/16 0809)   Anti-infectives    Start     Dose/Rate Route Frequency Ordered Stop   11/03/16 2359  meropenem (MERREM) 1 g in sodium chloride 0.9 % 100 mL IVPB     1 g 200 mL/hr over 30 Minutes Intravenous Every 8 hours 11/03/16 1552     11/02/16 0500  meropenem (MERREM) 1 g in sodium chloride 0.9 % 100 mL IVPB  Status:  Discontinued     1 g 200 mL/hr over 30 Minutes Intravenous Every 8 hours 11/02/16 0447 11/03/16 1552   11/02/16 0500  metroNIDAZOLE (FLAGYL) IVPB 500 mg  Status:  Discontinued     500 mg 100 mL/hr over 60 Minutes Intravenous Every 8 hours 11/02/16 0456 11/02/16 1656       Assessment/Plan Perforated sigmoid diverticulitis Exploratory laparotomy with sigmoid colectomy and descending colostomy, 11/02/16, Dr. Darnell Level POD # 2 Arthritis/Fibromyalgia Prediabetes  Hypothyroid  Headaches Temporal arteritis FEN:  IV fluids/NPO ID:  Flagyl 10/3&10/4;  Meropenem 11/01/16 =>> day 4 DVT:  Lovenox Foley:  OUT Follow up: [facility]   Plan:  Begin to mobilize more, ask ostomy nurse to see, I think she is going to have some issues with bag sealing medial aspect.  She has a fold in the wrong place.  Same issue with possible wound vac, I think they will have trouble with the seal. Keep her on sips and chips for now.  Await bowel function.    LOS: 2 days    Stephanye Finnicum 11/04/2016 [phone]

## 2016-11-05 NOTE — Progress Notes (Signed)
3 Days Post-Op    CC:  Abdominal pain  Subjective: Denies n/v.  Pain well controlled.    Objective: Vital signs in last 24 hours: Temp:  [97.6 F (36.4 C)-98.1 F (36.7 C)] 97.6 F (36.4 C) (10/06 0245) Pulse Rate:  [66-70] 67 (10/06 0245) Resp:  [12-20] 20 (10/06 0400) BP: (103-129)/(52-65) 129/52 (10/06 0245) SpO2:  [95 %-100 %] 98 % (10/06 0400) FiO2 (%):  [99 %] 99 % (10/05 1648)  30 PO 1330 IV 3400 urine No stool in ostomy Afebrile, VSS Labs OK WBC down to 9.1    Intake/Output from previous day: 10/05 0701 - 10/06 0700 In: 4841.7 [I.V.:4541.7; IV Piggyback:300] Out: 3600 [Urine:3600] Intake/Output this shift: No intake/output data recorded.  \No real change in exam.   General appearance: alert, cooperative and no distress Resp: breathing comfortably GI: soft sore, sites looks fine on wet to dry dressings.  + BS, some gas and sweat in ostomy bag.    Lab Results:   Recent Labs  11/03/16 0328 11/04/16 0440  WBC 16.2* 9.1  HGB 14.1 13.2  HCT 43.5 42.6  PLT 193 172    BMET  Recent Labs  11/03/16 0328 11/04/16 0440  NA 144 144  K 3.7 4.0  CL 116* 117*  CO2 22 21*  GLUCOSE 193* 126*  BUN <5* <5*  CREATININE 0.72 0.65  CALCIUM 8.1* 8.3*   PT/INR No results for input(s): LABPROT, INR in the last 72 hours.   Recent Labs Lab 11/02/16 0459 11/04/16 0440  AST 23 21  ALT 24 20  ALKPHOS 35* 29*  BILITOT 0.7 0.4  PROT 5.6* 4.9*  ALBUMIN 3.7 3.1*     Lipase     Component Value Date/Time   LIPASE 28 11/02/2016 0459     Prior to Admission medications   Medication Sig Start Date End Date Taking? Authorizing Provider  acetaminophen (TYLENOL) 325 MG tablet Take 650 mg by mouth every 6 (six) hours as needed for mild pain.    Yes [provider]  albuterol (PROAIR HFA) 108 (90 BASE) MCG/ACT inhaler Inhale 2 puffs into the lungs every 6 (six) hours as needed for wheezing or shortness of breath.   Yes [provider]  docusate  sodium (COLACE) 100 MG capsule Take 100 mg by mouth daily as needed for mild constipation.   Yes [provider]  DULoxetine (CYMBALTA) 30 MG capsule Take 30 mg by mouth daily.   Yes [provider]  esomeprazole (NEXIUM) 40 MG capsule Take 40 mg by mouth daily at 12 noon.   Yes [provider]  gabapentin (NEURONTIN) 300 MG capsule Take 900 mg by mouth at bedtime.   Yes [provider]  HYDROcodone-acetaminophen (NORCO/VICODIN) 5-325 MG tablet Take 1-2 tablets by mouth every 6 (six) hours as needed for moderate pain.   Yes [provider]  levothyroxine (SYNTHROID, LEVOTHROID) 50 MCG tablet Take 50 mcg by mouth daily before breakfast.   Yes [provider]  lovastatin (MEVACOR) 40 MG tablet Take 40 mg by mouth at bedtime.   Yes [provider]  mometasone-formoterol (DULERA) 200-5 MCG/ACT AERO Inhale 2 puffs into the lungs daily.   Yes [provider]  montelukast (SINGULAIR) 10 MG tablet Take 10 mg by mouth daily. 09/13/16  Yes [provider]  predniSONE (DELTASONE) 1 MG tablet Take 1 tablet by mouth daily.   Yes [provider]  SUMAtriptan (IMITREX) 50 MG tablet Take 1 tablet (50 mg total) by mouth  every 2 (two) hours as needed for migraine. May repeat in 2 hours if headache persists or recurs. 08/29/16  Yes Levert Feinstein, MD  Tocilizumab 162 MG/0.9ML SOSY Inject 162 mg into the skin every 7 (seven) days.   Yes [provider]  topiramate (TOPAMAX) 100 MG tablet Take 1 tablet (100 mg total) by mouth 2 (two) times daily. 08/29/16  Yes Levert Feinstein, MD  Triamcinolone Acetonide (TRIAMCINOLONE 0.1 % CREAM : EUCERIN) CREA Apply 1 application topically as needed for itching.    Yes [provider]    Medications: . acetaminophen  1,000 mg Oral Q8H  . DULoxetine  30 mg Oral Daily  . enoxaparin (LOVENOX) injection  40 mg Subcutaneous Q24H  . gabapentin  900 mg Oral QHS  . HYDROmorphone   Intravenous  Q4H  . levothyroxine  50 mcg Oral QAC breakfast  . mometasone-formoterol  2 puff Inhalation Daily  . montelukast  10 mg Oral Daily  . pantoprazole (PROTONIX) IV  40 mg Intravenous QHS  . pravastatin  40 mg Oral q1800  . predniSONE  1 mg Oral Daily  . topiramate  100 mg Oral BID   . dextrose 5 % and 0.45 % NaCl with KCl 20 mEq/L 125 mL/hr at 11/05/16 0653  . meropenem (MERREM) IV Stopped (11/05/16 0030)   Anti-infectives    Start     Dose/Rate Route Frequency Ordered Stop   11/03/16 2359  meropenem (MERREM) 1 g in sodium chloride 0.9 % 100 mL IVPB     1 g 200 mL/hr over 30 Minutes Intravenous Every 8 hours 11/03/16 1552     11/02/16 0500  meropenem (MERREM) 1 g in sodium chloride 0.9 % 100 mL IVPB  Status:  Discontinued     1 g 200 mL/hr over 30 Minutes Intravenous Every 8 hours 11/02/16 0447 11/03/16 1552   11/02/16 0500  metroNIDAZOLE (FLAGYL) IVPB 500 mg  Status:  Discontinued     500 mg 100 mL/hr over 60 Minutes Intravenous Every 8 hours 11/02/16 0456 11/02/16 1656       Assessment/Plan Perforated sigmoid diverticulitis Exploratory laparotomy with sigmoid colectomy and descending colostomy, 11/02/16, Dr. Darnell Level POD # 3 Arthritis/Fibromyalgia Prediabetes  Hypothyroid  Headaches Temporal arteritis FEN:  IV fluids/NPO ID:  Flagyl 10/3&10/4;  Meropenem 11/01/16 =>> day 4 DVT:  Lovenox Foley:  OUT Follow up: [facility]   Plan:Clear liquids PCA Decrease IVF    LOS: 3 days    Erica Caldwell 11/05/2016

## 2016-11-06 MED ORDER — PANTOPRAZOLE SODIUM 40 MG PO TBEC
40.0000 mg | DELAYED_RELEASE_TABLET | Freq: Every day | ORAL | Status: DC
  Administered 2016-11-06 – 2016-11-07 (×2): 40 mg via ORAL
  Filled 2016-11-06 (×2): qty 1

## 2016-11-06 NOTE — Progress Notes (Signed)
Pharmacy IV to PO conversion  The patient is receiving Pantoprazole by the intravenous route.  Based on criteria approved by the Pharmacy and Therapeutics Committee and the Medical Executive Committee, the medication is being converted to the equivalent oral dose form.   No active GI bleeding or impaired absorption  Not s/p esophagectomy  Documented ability to take oral medications for > 24 hr  Plan to continue treatment for at least 1 day  If you have any questions about this conversion, please contact the Pharmacy Department (ext [phone]).  Thank you.  Bernadene Person, PharmD Pager: [phone] 11/06/2016, 2:53 PM

## 2016-11-06 NOTE — Progress Notes (Signed)
4 Days Post-Op    CC:  Abdominal pain  Subjective: Denies n/v.  Pain well controlled.    Objective: Vital signs in last 24 hours: Temp:  [97.7 F (36.5 C)-98.6 F (37 C)] 97.8 F (36.6 C) (10/07 0846) Pulse Rate:  [70-81] 72 (10/07 0846) Resp:  [14-22] 16 (10/07 0846) BP: (102-134)/(47-68) 118/57 (10/07 0846) SpO2:  [92 %-100 %] 97 % (10/07 0846) FiO2 (%):  [96 %] 96 % (10/07 0815)  30 PO 1330 IV 3400 urine No stool in ostomy Afebrile, VSS Labs OK WBC down to 9.1    Intake/Output from previous day: 10/06 0701 - 10/07 0700 In: 2346.3 [P.O.:740; I.V.:1406.3; IV Piggyback:200] Out: 3375 [Urine:3375] Intake/Output this shift: No intake/output data recorded.  \No real change in exam.   General appearance: alert, cooperative and no distress Resp: breathing comfortably GI: soft sore, sites looks fine on wet to dry dressings.  + BS, some gas and sweat in ostomy bag.    Lab Results:   Recent Labs  11/04/16 0440  WBC 9.1  HGB 13.2  HCT 42.6  PLT 172    BMET  Recent Labs  11/04/16 0440  NA 144  K 4.0  CL 117*  CO2 21*  GLUCOSE 126*  BUN <5*  CREATININE 0.65  CALCIUM 8.3*   PT/INR No results for input(s): LABPROT, INR in the last 72 hours.   Recent Labs Lab 11/02/16 0459 11/04/16 0440  AST 23 21  ALT 24 20  ALKPHOS 35* 29*  BILITOT 0.7 0.4  PROT 5.6* 4.9*  ALBUMIN 3.7 3.1*     Lipase     Component Value Date/Time   LIPASE 28 11/02/2016 0459     Prior to Admission medications   Medication Sig Start Date End Date Taking? Authorizing Provider  acetaminophen (TYLENOL) 325 MG tablet Take 650 mg by mouth every 6 (six) hours as needed for mild pain.    Yes [provider]  albuterol (PROAIR HFA) 108 (90 BASE) MCG/ACT inhaler Inhale 2 puffs into the lungs every 6 (six) hours as needed for wheezing or shortness of breath.   Yes [provider]  docusate sodium (COLACE) 100 MG capsule Take 100 mg by mouth daily as needed for mild  constipation.   Yes [provider]  DULoxetine (CYMBALTA) 30 MG capsule Take 30 mg by mouth daily.   Yes [provider]  esomeprazole (NEXIUM) 40 MG capsule Take 40 mg by mouth daily at 12 noon.   Yes [provider]  gabapentin (NEURONTIN) 300 MG capsule Take 900 mg by mouth at bedtime.   Yes [provider]  HYDROcodone-acetaminophen (NORCO/VICODIN) 5-325 MG tablet Take 1-2 tablets by mouth every 6 (six) hours as needed for moderate pain.   Yes [provider]  levothyroxine (SYNTHROID, LEVOTHROID) 50 MCG tablet Take 50 mcg by mouth daily before breakfast.   Yes [provider]  lovastatin (MEVACOR) 40 MG tablet Take 40 mg by mouth at bedtime.   Yes [provider]  mometasone-formoterol (DULERA) 200-5 MCG/ACT AERO Inhale 2 puffs into the lungs daily.   Yes [provider]  montelukast (SINGULAIR) 10 MG tablet Take 10 mg by mouth daily. 09/13/16  Yes [provider]  predniSONE (DELTASONE) 1 MG tablet Take 1 tablet by mouth daily.   Yes [provider]  SUMAtriptan (IMITREX) 50 MG tablet Take 1 tablet (50 mg total) by mouth every 2 (two) hours as needed for migraine. May repeat in 2 hours if headache  persists or recurs. 08/29/16  Yes Levert Feinstein, MD  Tocilizumab 162 MG/0.9ML SOSY Inject 162 mg into the skin every 7 (seven) days.   Yes [provider]  topiramate (TOPAMAX) 100 MG tablet Take 1 tablet (100 mg total) by mouth 2 (two) times daily. 08/29/16  Yes Levert Feinstein, MD  Triamcinolone Acetonide (TRIAMCINOLONE 0.1 % CREAM : EUCERIN) CREA Apply 1 application topically as needed for itching.    Yes [provider]    Medications: . acetaminophen  1,000 mg Oral Q8H  . DULoxetine  30 mg Oral Daily  . enoxaparin (LOVENOX) injection  40 mg Subcutaneous Q24H  . gabapentin  900 mg Oral QHS  . HYDROmorphone   Intravenous Q4H  . levothyroxine  50 mcg Oral QAC breakfast  . mometasone-formoterol  2  puff Inhalation Daily  . montelukast  10 mg Oral Daily  . pantoprazole (PROTONIX) IV  40 mg Intravenous QHS  . pravastatin  40 mg Oral q1800  . predniSONE  1 mg Oral Daily  . topiramate  100 mg Oral BID   . dextrose 5 % and 0.45 % NaCl with KCl 20 mEq/L 50 mL/hr at 11/05/16 0845  . meropenem (MERREM) IV 1 g (11/06/16 0829)   Anti-infectives    Start     Dose/Rate Route Frequency Ordered Stop   11/03/16 2359  meropenem (MERREM) 1 g in sodium chloride 0.9 % 100 mL IVPB     1 g 200 mL/hr over 30 Minutes Intravenous Every 8 hours 11/03/16 1552     11/02/16 0500  meropenem (MERREM) 1 g in sodium chloride 0.9 % 100 mL IVPB  Status:  Discontinued     1 g 200 mL/hr over 30 Minutes Intravenous Every 8 hours 11/02/16 0447 11/03/16 1552   11/02/16 0500  metroNIDAZOLE (FLAGYL) IVPB 500 mg  Status:  Discontinued     500 mg 100 mL/hr over 60 Minutes Intravenous Every 8 hours 11/02/16 0456 11/02/16 1656       Assessment/Plan Perforated sigmoid diverticulitis Exploratory laparotomy with sigmoid colectomy and descending colostomy, 11/02/16, Dr. Darnell Level POD # 4 Arthritis/Fibromyalgia Prediabetes  Hypothyroid  Headaches Temporal arteritis FEN:  IV fluids/NPO ID:  Flagyl 10/3&10/4;  Meropenem 11/01/16 =>> day 4 DVT:  Lovenox Foley:  OUT Follow up: [facility]   Plan: Full liquids. Hopefully tomorrow will have flatus in bag and diet can advance. Labs tomorrow. **sutures in foot need to be removed.** Will discuss with team tomorrow.      LOS: 4 days    Marcee Jacobs 11/06/2016

## 2016-11-07 LAB — BASIC METABOLIC PANEL
ANION GAP: 8 (ref 5–15)
BUN: 7 mg/dL (ref 6–20)
CALCIUM: 8.6 mg/dL — AB (ref 8.9–10.3)
CO2: 19 mmol/L — ABNORMAL LOW (ref 22–32)
Chloride: 112 mmol/L — ABNORMAL HIGH (ref 101–111)
Creatinine, Ser: 0.64 mg/dL (ref 0.44–1.00)
GLUCOSE: 97 mg/dL (ref 65–99)
Potassium: 3.8 mmol/L (ref 3.5–5.1)
Sodium: 139 mmol/L (ref 135–145)

## 2016-11-07 LAB — CBC
HCT: 47 % — ABNORMAL HIGH (ref 36.0–46.0)
Hemoglobin: 16 g/dL — ABNORMAL HIGH (ref 12.0–15.0)
MCH: 31.6 pg (ref 26.0–34.0)
MCHC: 34 g/dL (ref 30.0–36.0)
MCV: 92.9 fL (ref 78.0–100.0)
PLATELETS: 145 10*3/uL — AB (ref 150–400)
RBC: 5.06 MIL/uL (ref 3.87–5.11)
RDW: 15.4 % (ref 11.5–15.5)
WBC: 8.9 10*3/uL (ref 4.0–10.5)

## 2016-11-07 MED ORDER — OXYCODONE HCL 5 MG PO TABS
5.0000 mg | ORAL_TABLET | ORAL | Status: DC | PRN
  Administered 2016-11-07: 5 mg via ORAL
  Filled 2016-11-07: qty 1

## 2016-11-07 MED ORDER — HYDROMORPHONE HCL 1 MG/ML IJ SOLN
0.5000 mg | INTRAMUSCULAR | Status: DC | PRN
  Administered 2016-11-07: 0.5 mg via INTRAVENOUS
  Filled 2016-11-07: qty 1

## 2016-11-07 NOTE — Progress Notes (Signed)
Patient states that right above her right anticubital space area is sore from where IV was placed. Dr Andrey Campanile requested a warm pack to go on it. Pt given warm pack for this area. Lina Sar, RN

## 2016-11-07 NOTE — Consult Note (Signed)
WOC Nurse wound consult note Reason for Consult: placement of NPWT dressing Wound type: surgical  Pressure Injury POA: NA Measurement: 14cm x 4.7cm x 5.0cm  Wound bed:100% red, subcutaneous tissue Drainage (amount, consistency, odor) minimal, serosanguinous, no odor  Periwound:intact  Dressing procedure/placement/frequency: 1pc of black foam used to fill wound bed, deepest portion distal is 5cm deep. 1/2 of ostomy barrier ring used along the edge at umbilicus and near ostomy crease Draped and sealed at .  Patient tolerated well, requested PO pain meds after dressing change.  OK for bedside nurse to change M/W/F.   WOC Nurse ostomy follow up Stoma type/location: end colostomy, LLQ, located in crease Stomal assessment/size: oval shaped, with dip in abdominal contour that is prominent at 9 o'clock  Peristomal assessment: intact Treatment options for stomal/peristomal skin: used 1/2 of barrier ring x 2 to flatten the creasing at 9 o'clock Concerned that this patient may need more flexible convexity.  I will order for patient to have in the room.   Output liquid brown, with lots of flatus Ostomy pouching: 1pc.convex, using barrier ring to flatten the dip in the skin at 9 o'clock .  Education provided:  Demonstrated pouch change to patient, she will have assistance from her sister however her sister is not able to be present. Her husband left the room and he will not be helping at all at home. She was able to close lock and roll closure independently today.  I have demonstrated how to use toliet paper wick to clean the spout, cut new skin barrier, place barrier ring to flatten creasing. Place new pouch.    Fearful that when she starts to sit up this pouch will not be flexible enough, I will add a ostomy belt today to see if this will help with the creasing and loosening of the wafer on the medial side.  Enrolled patient in [place] Secure Start Discharge program: Yes  Will need [facility]  for support with ostomy care and NPWT VAC dressing changes.   WOC nurse team will follow along for continued support with ostomy teaching.  Joyleen Haselton [facility], CNS, [employer] [phone]

## 2016-11-07 NOTE — Progress Notes (Signed)
Mattie Marlin, PA paged to let her know wound vac form is on chart and awaiting signature per request of Arna Medici, Case Manager. Patient has chosen [employer] for [employer] needs. [employer] representative is on unit and aware. Lina Sar, RN

## 2016-11-07 NOTE — Progress Notes (Signed)
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 Gerrit Friends, 10/03  FEN: soft diet, protonix VTE: SCD's, lovenox ID: meropenum 10/03>> Foley: discontinued  Follow up: TBD  DISPO: soft diet, remove stitches in foot, case manager consult for [facility] needs, WOC consult for possible wound vac placement, DC PCA and PO pain meds. Hopefully home later this afternoon or tomorrow.    LOS: 5 days    Subjective:   CC; abdominal soreness  Good ostomy output, no nausea or vomiting. Tolerating diet. Pt is amenable to oral pain meds. Unsure if anyone at home can help with her wound care.   Objective: Vital signs in last 24 hours: Temp:  [97.9 F (36.6 C)-98.7 F (37.1 C)] 98 F (36.7 C) (10/08 0828) Pulse Rate:  [70-86] 77 (10/08 0828) Resp:  [13-19] 13 (10/08 0828) BP: (96-129)/(54-65) 96/56 (10/08 0828) SpO2:  [93 %-99 %] 96 % (10/08 0830) Last BM Date: 11/06/16  Intake/Output from previous day: 10/07 0701 - 10/08 0700 In: 1820 [P.O.:420; I.V.:1200; IV Piggyback:200] Out: 1420 [Urine:1300; Stool:120] Intake/Output this shift: No intake/output data recorded.  PE: Gen:  Alert, NAD, pleasant, cooperative Card:  RRR, no M/G/R heard Pulm:  CTA, no W/R/R, effort normal Abd: Soft, not distended, +BS, midline incision appears well without purulent drainage , ostomy pink and stool in bag Skin: no rashes noted, warm and dry Extremities: right foot with stitches on the dorsal aspect, wound appears well healing without surrounding erythema or drainage   Anti-infectives: Anti-infectives    Start     Dose/Rate Route Frequency Ordered  Stop   11/03/16 2359  meropenem (MERREM) 1 g in sodium chloride 0.9 % 100 mL IVPB     1 g 200 mL/hr over 30 Minutes Intravenous Every 8 hours 11/03/16 1552     11/02/16 0500  meropenem (MERREM) 1 g in sodium chloride 0.9 % 100 mL IVPB  Status:  Discontinued     1 g 200 mL/hr over 30 Minutes Intravenous Every 8 hours 11/02/16 0447 11/03/16 1552   11/02/16 0500  metroNIDAZOLE (FLAGYL) IVPB 500 mg  Status:  Discontinued     500 mg 100 mL/hr over 60 Minutes Intravenous Every 8 hours 11/02/16 0456 11/02/16 1656      Lab Results:   Recent Labs  11/07/16 0517  WBC 8.9  HGB 16.0*  HCT 47.0*  PLT 145*   BMET  Recent Labs  11/07/16 0517  NA 139  K 3.8  CL 112*  CO2 19*  GLUCOSE 97  BUN 7  CREATININE 0.64  CALCIUM 8.6*   PT/INR No results for input(s): LABPROT, INR in the last 72 hours. CMP     Component Value Date/Time   NA 139 11/07/2016 0517   K 3.8 11/07/2016 0517   CL 112 (H) 11/07/2016 0517   CO2 19 (L) 11/07/2016 0517   GLUCOSE 97 11/07/2016 0517   BUN 7 11/07/2016 0517   CREATININE 0.64 11/07/2016 0517   CALCIUM 8.6 (L) 11/07/2016 0517   PROT 4.9 (L) 11/04/2016 0440   ALBUMIN 3.1 (L) 11/04/2016 0440   AST 21 11/04/2016 0440   ALT 20 11/04/2016 0440   ALKPHOS 29 (L) 11/04/2016 0440   BILITOT 0.4 11/04/2016 0440  GFRNONAA >60 11/07/2016 0517   GFRAA >60 11/07/2016 0517   Lipase     Component Value Date/Time   LIPASE 28 11/02/2016 0459    Studies/Results: No results found.    Jerre Simon , [facility] Surgery 11/07/2016, 9:23 AM Pager: [phone] Consults: [phone] Mon-Fri 7:00 am-4:30 pm Sat-Sun 7:00 am-11:30 am

## 2016-11-07 NOTE — Progress Notes (Signed)
Stitches in right foot removed after scrubbing with Chlorhexadine. Steri strips applied. Lina Sar, RN

## 2016-11-07 NOTE — Evaluation (Addendum)
Physical Therapy Evaluation Patient Details Name: Erica Caldwell MRN: [id number] DOB: 1955-12-21 Today's Date: 11/07/2016   History of Present Illness  61 y.o. female admitted with perforated sigmoid diverticulitis, s/p exploratory laparoscopy with sigmoid colectomy and colostomy 11/02/16. Recent morton's neuroma excision R foot -has post op shoe.   Clinical Impression  Pt admitted with above diagnosis. Pt currently with functional limitations due to the deficits listed below (see PT Problem List). Pt ambulated 34' with min hand held assist for balance, expect balance will improve once she has a shoe to wear on L foot.  Pt will benefit from skilled PT to increase their independence and safety with mobility to allow discharge to the venue listed below.       Follow Up Recommendations No PT follow up    Equipment Recommendations  None recommended by PT    Recommendations for Other Services       Precautions / Restrictions Precautions Precautions: Other (comment) Precaution Comments: abdominal incision, recent R morton's neuroma surgery  Required Braces or Orthoses: Other Brace/Splint Other Brace/Splint: R post op shoe Restrictions Weight Bearing Restrictions: No      Mobility  Bed Mobility Overal bed mobility: independent Instructed pt in log roll                Transfers Overall transfer level: Needs assistance Equipment used: None Transfers: Sit to/from Stand Sit to Stand: Min guard         General transfer comment: min/guard for safety  Ambulation/Gait Ambulation/Gait assistance: Min assist Ambulation Distance (Feet): 75 Feet Assistive device: 1 person hand held assist Gait Pattern/deviations: Decreased stride length;Step-to pattern   Gait velocity interpretation: Below normal speed for age/gender General Gait Details: pt had R postop shoe but no shoe on L foot, expect will be more steady with shoe on L (family to bring in), min HHA for  balance  Stairs            Wheelchair Mobility    Modified Rankin (Stroke Patients Only)       Balance Overall balance assessment: Needs assistance   Sitting balance-Leahy Scale: Good       Standing balance-Leahy Scale: Fair                               Pertinent Vitals/Pain Pain Assessment: 0-10 Pain Score: 2  Pain Location: abdomen Pain Descriptors / Indicators: Sore Pain Intervention(s): Limited activity within patient's tolerance;Monitored during session;Premedicated before session    Home Living Family/patient expects to be discharged to:: Private residence Living Arrangements: Spouse/significant other Available Help at Discharge: Family;Available 24 hours/day   Home Access: Level entry     Home Layout: One level Home Equipment: None      Prior Function Level of Independence: Independent               Hand Dominance        Extremity/Trunk Assessment   Upper Extremity Assessment Upper Extremity Assessment: Overall WFL for tasks assessed    Lower Extremity Assessment Lower Extremity Assessment: Overall WFL for tasks assessed       Communication   Communication: No difficulties  Cognition Arousal/Alertness: Awake/alert Behavior During Therapy: WFL for tasks assessed/performed Overall Cognitive Status: Within Functional Limits for tasks assessed  General Comments      Exercises     Assessment/Plan    PT Assessment Patient needs continued PT services  PT Problem List Decreased activity tolerance;Decreased mobility       PT Treatment Interventions Gait training;Therapeutic exercise;Therapeutic activities    PT Goals (Current goals can be found in the Care Plan section)  Acute Rehab PT Goals Patient Stated Goal: likes to read, watch tv PT Goal Formulation: With patient Time For Goal Achievement: 11/21/16 Potential to Achieve Goals: Good    Frequency Min  3X/week   Barriers to discharge        Co-evaluation               AM-PAC PT "6 Clicks" Daily Activity  Outcome Measure Difficulty turning over in bed (including adjusting bedclothes, sheets and blankets)?: None Difficulty moving from lying on back to sitting on the side of the bed? : None Difficulty sitting down on and standing up from a chair with arms (e.g., wheelchair, bedside commode, etc,.)?: Unable Help needed moving to and from a bed to chair (including a wheelchair)?: A Little Help needed walking in hospital room?: A Little Help needed climbing 3-5 steps with a railing? : A Little 6 Click Score: 18    End of Session Equipment Utilized During Treatment: Gait belt Activity Tolerance: Patient tolerated treatment well Patient left: in chair;with call bell/phone within reach Nurse Communication: Mobility status PT Visit Diagnosis: Difficulty in walking, not elsewhere classified (R26.2);Pain;Unsteadiness on feet (R26.81)    Time: 1610-9604 PT Time Calculation (min) (ACUTE ONLY): 14 min   Charges:   PT Evaluation $PT Eval Low Complexity: 1 Low     PT G Codes:         Tamala Ser 11/07/2016, 3:45 PM [phone]

## 2016-11-07 NOTE — Progress Notes (Signed)
Dilaudid PCA DC'd; of dilaudid wasted in sink with Cathleen Corti, RN and charge nurse. Could not find PCA listed in Pyxis. Lina Sar, RN

## 2016-11-08 MED ORDER — OXYCODONE HCL 5 MG PO TABS: 5.0000 mg | ORAL_TABLET | ORAL | 0 refills | Status: DC | PRN

## 2016-11-08 MED ORDER — METHOCARBAMOL 500 MG PO TABS: 500.0000 mg | ORAL_TABLET | Freq: Four times a day (QID) | ORAL | 0 refills | Status: AC | PRN

## 2016-11-08 NOTE — Progress Notes (Signed)
Wound vac removed, Wet to dry dsg applied to mid abdominal incision.  Dressing change teaching done with patient and sister.  Discharge instructions reviewed with patient and family.  Josealberto Montalto RN

## 2016-11-08 NOTE — Discharge Instructions (Signed)
[facility]      [facility], [place] [phone]  OPEN ABDOMINAL SURGERY: POST OP INSTRUCTIONS  Always review your discharge instruction sheet given to you by the facility where your surgery was performed.  IF YOU HAVE DISABILITY OR FAMILY LEAVE FORMS, YOU MUST BRING THEM TO THE OFFICE FOR PROCESSING.  PLEASE DO NOT GIVE THEM TO YOUR DOCTOR.  1. A prescription for pain medication may be given to you upon discharge.  Take your pain medication as prescribed, if needed.  If narcotic pain medicine is not needed, then you may take acetaminophen (Tylenol) or ibuprofen (Advil) as needed. 2. Take your usually prescribed medications unless otherwise directed. 3. If you need a refill on your pain medication, please contact your pharmacy. They will contact our office to request authorization.  Prescriptions will not be filled after 5pm or on week-ends. 4. You should follow a light diet the first few days after arrival home, such as soup and crackers, pudding, etc.unless your doctor has advised otherwise. A high-fiber, low fat diet can be resumed as tolerated.   Be sure to include lots of fluids daily. Most patients will experience some swelling and bruising on the chest and neck area.  Ice packs will help.  Swelling and bruising can take several days to resolve 5. Most patients will experience some swelling and bruising in the area of the incision. Ice pack will help. Swelling and bruising can take several days to resolve..  6. It is common to experience some constipation if taking pain medication after surgery.  Increasing fluid intake and taking a stool softener will usually help or prevent this problem from occurring.  A mild laxative (Milk of Magnesia or Miralax) should be taken according to package directions if there are no bowel movements after 48 hours. 7.  Home health will come to your home to change your wound vac and to help with your ostomy. Call them if you are having any issues with your wound  vac or ostomy.  8. ACTIVITIES:  You may resume regular (light) daily activities beginning the next day--such as daily self-care, walking, climbing stairs--gradually increasing activities as tolerated.  You may have sexual intercourse when it is comfortable.  Refrain from any heavy lifting or straining until approved by your doctor. No lifting >20lbs for 6 weeks a. You may drive when you no longer are taking prescription pain medication, you can comfortably wear a seatbelt, and you can safely maneuver your car and apply brakes b. Return to Work: Dr. Gerrit Friends will discuss return to work with you at your follow up appointment. 9. You should see your doctor in the office for a follow-up appointment approximately two weeks after your surgery.  Make sure that you call for this appointment within a day or two after you arrive home to insure a convenient appointment time.  WHEN TO CALL YOUR DOCTOR: 1. Fever over 101.0 2. Inability to urinate 3. Nausea and/or vomiting 4. Extreme swelling or bruising 5. Continued bleeding from incision. 6. Increased pain, redness, or drainage from the incision. 7. Difficulty swallowing or breathing 8. Muscle cramping or spasms. 9. Numbness or tingling in hands or feet or around lips.  The clinic staff is available to answer your questions during regular business hours.  Please dont hesitate to call and ask to speak to one of the nurses if you have concerns.  For further questions, please visit [email or web address]   Colostomy, Adult, Care After Refer to this sheet in the next few weeks.  These instructions provide you with information about caring for yourself after your procedure. Your health care provider may also give you more specific instructions. Your treatment has been planned according to current medical practices, but problems sometimes occur. Call your health care provider if you have any problems or questions after your procedure. What can I expect  after the procedure? After the procedure, it is common to have:  Swelling at the opening that was created during the procedure (stoma).  Slight bleeding around the stoma.  Redness around the stoma.  Follow these instructions at home: Activity  Rest as needed while the stoma area heals.  Return to your normal activities as told by your health care provider. Ask your health care provider what activities are safe for you.  Avoid strenuous activity and abdominal exercises for 3 weeks or for as long as told by your health care provider.  Do not lift anything that is heavier than 10 lb (4.5 kg). Incision care   Follow instructions from your health care provider about how to take care of your incision. Make sure you: ? Wash your hands with soap and water before you change your bandage (dressing). If soap and water are not available, use hand sanitizer. ? Change your dressing as told by your health care provider. ? Leave stitches (sutures), skin glue, or adhesive strips in place. These skin closures may need to stay in place for 2 weeks or longer. If adhesive strip edges start to loosen and curl up, you may trim the loose edges. Do not remove adhesive strips completely unless your health care provider tells you to do that. Stoma Care  Keep the stoma area clean.  Clean and dry the skin around the stoma each time you change the colostomy bag. To clean the stoma area: ? Use warm water and only use cleansers that are recommended by your health care provider. ? Rinse the stoma area with plain water. ? Dry the area well.  Use stoma powder or ointment on your skin only as told by your health care provider. Do not use any other powders, gels, wipes, or creams on your skin.  Check the stoma area every day for signs of infection. Check for: ? More redness, swelling, or pain. ? More fluid or blood. ? Pus or warmth.  Measure the stoma opening regularly and record the size. Watch for changes.  Share this information with your health care provider. Bathing  Do not take baths, swim, or use a hot tub until your health care provider approves. Ask your health care provider if you can take showers. You may be able to shower with or without the colostomy bag in place. If you bathe with the bag on, dry the bag afterward.  Avoid using harsh or oily soaps when you bathe. Colostomy Bag Care  Follow instructions from your health care provider about how to empty or change the colostomy bag.  Keep colostomy supplies with you at all times.  Store all supplies in a cool, dry place.  Empty the colostomy bag: ? Whenever it is one-third to one-half full. ? At bedtime.  Replace the bag every 2-4 days or as told by your health care provider. Driving  Do not drive for 24 hours if you received a sedative.  Do not drive or operate heavy machinery while taking prescription pain medicine. General instructions  Follow instructions from your health care provider about eating or drinking restrictions.  Take over-the-counter and prescription medicines only as told  by your health care provider.  Avoid wearing clothes that are tight directly over your stoma.  Do not use any tobacco products, such as cigarettes, chewing tobacco, and e-cigarettes. If you need help quitting, ask your health care provider.  (Women) Ask your health care provider about becoming pregnant and about using birth control. Medicines may not be absorbed normally after the procedure.  Keep all follow-up visits as told by your health care provider. This is important. Contact a health care provider if:  You are having trouble caring for your stoma or changing the colostomy bag.  You feel nauseous or you vomit.  You have a fever.  You havemore redness, swelling, or pain at the site of your stoma or around your anus.  You have more fluid or blood coming from your stoma or your anus.  Your stoma area feels warm to the  touch.  You have pus coming from your stoma.  You notice a change in the size or appearance of the stoma.  You have abdominal pain, bloating, pressure, or cramping.  Your have stool more often or less often than your health care provider tells you to expect.  You are not making much urine. This may be a sign of dehydration. Get help right away if:  Your abdominal pain does not go away or it becomes severe.  You keep vomiting.  Your stool is not draining through the stoma.  You have chest pain or an irregular heartbeat. This information is not intended to replace advice given to you by your health care provider. Make sure you discuss any questions you have with your health care provider. Document Released: 06/09/2010 Document Revised: 05/28/2015 Document Reviewed: 09/30/2014 Elsevier Interactive Patient Education  2018 [employer].

## 2016-11-08 NOTE — Progress Notes (Signed)
CM consult for home health services and home vac. This CM met with pt at bedside to offer choice and [facility] chosen. [facility] vac to be used for home. [facility] rep alerted of referral and need for home vac.  Marney Doctor RN,BSN,NCM [phone]

## 2016-11-08 NOTE — Progress Notes (Signed)
Per [facility] rep, Vac will not be covered by insurance at home. Jessica [facility] PA informed and order received for wet to dry dressings at home.  Sandford Craze RN,BSN,NCM [phone]

## 2016-11-08 NOTE — Discharge Summary (Signed)
[facility]/Trauma Discharge Summary   Patient ID: Erica Caldwell MRN: [id number] DOB/AGE: 1956/01/08 61 y.o.  Admit date: 11/01/2016 Discharge date: 11/08/2016  Admitting Diagnosis: Perforated sigmoid diverticulitis with intraabdominal free air  Discharge Diagnosis Patient Active Problem List   Diagnosis Date Noted  . Perforated sigmoid colon (HCC) 11/02/2016  . Perforation of sigmoid colon due to diverticulitis 11/02/2016  . Chronic migraine 08/29/2016  . Obesity 09/24/2014  . Mild persistent asthma 08/12/2014    Consultants none  Imaging: No results found.  Procedures Dr. 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  Workup showed perforated sigmoid diverticulitis with intraabdominal free air.  Patient was admitted and underwent procedure listed above.  Tolerated procedure well and was transferred to the floor.  Diet was advanced as tolerated.  Pt had recent removal of a neuroma by Dr. Zenda Alpers of [facility]. After talking with Dr. Zenda Alpers, we removed her stitches on 10/08 from her right foot. On POD#6, the patient was voiding well, tolerating diet, ambulating well, pain well controlled, vital signs stable, incisions c/d/i and felt stable for discharge home.  Patient will follow up in our office in 2 weeks and knows to call with questions or concerns.  She will call to confirm appointment date/time.    Patient was discharged in good condition.  The [place] Substance controlled database was reviewed prior to prescribing narcotic pain medication to this patient.  Physical Exam: Gen: Alert, NAD, pleasant, cooperative Card: RRR, no M/G/R heard Pulm: CTA, no W/R/R, effort normal Abd: Soft, not distended, +BS, midline incision with wound vac in place, ostomy pink and stool in bag Skin: no rashes noted, warm and  dry Extremities: right foot with steri-strips on the dorsal aspect, no surrounding erythema or drainage  Allergies as of 11/08/2016      Reactions   Levaquin  [levofloxacin In D5w] Palpitations   Other Itching, Rash   Perfume- shortness of breath; watery eyes; headache   Latex Hives   Penicillins Other (See Comments)   Unsure of reaction   Silver Hives   Patient complains of blisters/itching all over body.    Cefazolin Other (See Comments)   Chloraprep One Step [chlorhexidine Gluconate] Rash   Tape Rash   Including tegaderm      Medication List    STOP taking these medications   HYDROcodone-acetaminophen 5-325 MG tablet Commonly known as:  NORCO/VICODIN     TAKE these medications   acetaminophen 325 MG tablet Commonly known as:  TYLENOL Take 650 mg by mouth every 6 (six) hours as needed for mild pain.   docusate sodium 100 MG capsule Commonly known as:  COLACE Take 100 mg by mouth daily as needed for mild constipation.   DULERA 200-5 MCG/ACT Aero Generic drug:  mometasone-formoterol Inhale 2 puffs into the lungs daily.   DULoxetine 30 MG capsule Commonly known as:  CYMBALTA Take 30 mg by mouth daily.   esomeprazole 40 MG capsule Commonly known as:  NEXIUM Take 40 mg by mouth daily at 12 noon.   gabapentin 300 MG capsule Commonly known as:  NEURONTIN Take 900 mg by mouth at bedtime.   levothyroxine 50 MCG tablet Commonly known as:  SYNTHROID, LEVOTHROID Take 50 mcg by mouth daily before breakfast.   lovastatin 40 MG tablet Commonly known as:  MEVACOR Take 40 mg by mouth at bedtime.   methocarbamol  500 MG tablet Commonly known as:  ROBAXIN Take 1 tablet (500 mg total) by mouth every 6 (six) hours as needed for muscle spasms.   montelukast 10 MG tablet Commonly known as:  SINGULAIR Take 10 mg by mouth daily.   oxyCODONE 5 MG immediate release tablet Commonly known as:  Oxy IR/ROXICODONE Take 1 tablet (5 mg total) by mouth every 4 (four) hours as needed  (  for moderate pain,  for severe pain).   predniSONE 1 MG tablet Commonly known as:  DELTASONE Take 1 tablet by mouth daily.   PROAIR HFA 108 (90 Base) MCG/ACT inhaler Generic drug:  albuterol Inhale 2 puffs into the lungs every 6 (six) hours as needed for wheezing or shortness of breath.   SUMAtriptan 50 MG tablet Commonly known as:  IMITREX Take 1 tablet (50 mg total) by mouth every 2 (two) hours as needed for migraine. May repeat in 2 hours if headache persists or recurs.   Tocilizumab 162 MG/0.9ML Sosy Inject 162 mg into the skin every 7 (seven) days.   topiramate 100 MG tablet Commonly known as:  TOPAMAX Take 1 tablet (100 mg total) by mouth 2 (two) times daily.   triamcinolone 0.1 % cream : eucerin Crea Apply 1 application topically as needed for itching.        Follow-up Information    Darnell Level, MD. Schedule an appointment as soon as possible for a visit in 2 week(s).   Specialty:  General Surgery Why:  for follow up Contact information: [address] [place] [address] [phone]           Signed: Joyce Copa [facility] 11/08/2016, 8:57 AM Pager: [phone] Consults: [phone] Mon-Fri 7:00 am-4:30 pm Sat-Sun 7:00 am-11:30 am

## 2016-11-25 NOTE — Progress Notes (Signed)
[facility]  PATIENT: Erica Caldwell DOB: [birth year]   REASON FOR VISIT: Follow-up for migraine, new complaint daytime somnolence HISTORY FROM: Patient    HISTORY OF PRESENT ILLNESS:08/29/16 Erica Caldwell is a 61 years old right-handed female, seen in refer by her primary care doctor Erica Caldwell for evaluation of chronic migraine, initial evaluation was on August 29 2016.  I reviewed and summarized most recent referring, she had a history of arthritis, fibromyalgia, chronic low back pain, asthma, had a history of left rotator cuff repair in 2014, left wrist, right ankle surgery, temporal artery biopsy in October 2017, there was normal.  She has a long history of migraine headache, typical migraine are right lateralized severe pounding headache with associated light noise sensitivity, nauseous, lasting 2-3 days, during the headache, she often have flashing light in her visual field proceeding to the onset of headaches, per patient, she was treated with antiepileptic medication many years ago, which has helped her headache,  She rarely has migraine headaches, round August 2017, she began to develop right persistent temporoparietal area low pressure pain, skin sensitivity, initially was intermittent, but September 2017, she noticed persistent symptoms, also complains of jaw pain, hurt to chew, as soon intermittent blurry vision, she was seen by ENT, had first right temporal artery biopsy, there was normal,  Per record, January 13 2016, sensory deficit, C-reactive protein of 6,  Clinically she was highly suspicious for joints cell arteritis, carotid Dopplers showed no significant stenosis, she was started on prednisone 60 mg daily in December 2017, complains of significant side effect including increased blood pressure, feeling anxious, palpitation, was lowered to 40 mg daily in 2 weeks, her symptoms improved and resolved but she continue have side effect, then  prednisone dose was continued tapered off the lower dose, while she was taking prednisone 20 mg daily by April 06 2016, she noticed recurrent right temporal headaches, jaw claudication, right skull sensitivity, the prednisone dosage was increased to 25 mg daily, noticed improvement of her symptoms,   Over the last few months, her prednisone was continued tapering to 3 mg on Jun 22 2016, she continue complains of low-grade right-sided pressure headache, skin sensitivity, she was recently treated with higher dose of prednisone 40 mg daily because of her upper respiratory symptoms, she denies significant improvement,  She also tried over-the-counter Tylenol, NSAIDs for headache with limited help, she did has diffuse body achy pain, fibromyalgia,  Laboratory evaluations in July 2018, normal C-reactive protein 0.5, mild elevated WBC 11.2, normal hemoglobin of 15, CMP with glucose of 109, normal ESR,  UPDATE 10/29/2018CM Erica Caldwell, 61 year old female returns for follow-up with history of migraines. When initially evaluated by Dr. Krista Blue she was placed on 100 mg of Topamax twice a day however she had side effects to the medication and she is currently just taking 100 mg at night. She continues to have headaches complains of daytime somnolence and snoring. She had recent Surgery for diverticulitis in October and has a colostomy bag and an  wound on her stomach. He claims she is under a lot of stress, recently found out that her father has cancer. She has not tried the Imitrex acutely. She is not aware of any migraine triggers except weather and stress. She returns for reevaluation  REVIEW OF SYSTEMS: Full 14 system review of systems performed and notable only for those listed, all others are neg:  Constitutional: Fatigue Cardiovascular: neg Ear/Nose/Throat: neg  Skin: neg Eyes: neg Respiratory: neg Gastroitestinal: neg  Hematology/Lymphatic:  neg  Endocrine: Intolerance to heat and cold Musculoskeletal:  Back pain, fibromyalgia Allergy/Immunology: Food allergies  Neurological: Headache Psychiatric: Anxiety Sleep : neg   ALLERGIES: Allergies  Allergen Reactions  . Levaquin  [Levofloxacin In D5w] Palpitations  . Other Itching and Rash    Perfume- shortness of breath; watery eyes; headache  . Latex Hives  . Penicillins Other (See Comments)    Unsure of reaction  . Silver Hives    Patient complains of blisters/itching all over body.   . Cefazolin Other (See Comments)  . Chloraprep One Step [Chlorhexidine Gluconate] Rash  . Tape Rash    Including tegaderm    HOME MEDICATIONS: Outpatient Medications Prior to Visit  Medication Sig Dispense Refill  . acetaminophen (TYLENOL) 325 MG tablet Take 650 mg by mouth every 6 (six) hours as needed for mild pain.     Marland Kitchen albuterol (PROAIR HFA) 108 (90 BASE) MCG/ACT inhaler Inhale 2 puffs into the lungs every 6 (six) hours as needed for wheezing or shortness of breath.    . docusate sodium (COLACE) 100 MG capsule Take 100 mg by mouth daily as needed for mild constipation.    . DULoxetine (CYMBALTA) 30 MG capsule Take 30 mg by mouth daily.    Marland Kitchen esomeprazole (NEXIUM) 40 MG capsule Take 40 mg by mouth daily at 12 noon.    . gabapentin (NEURONTIN) 300 MG capsule Take 900 mg by mouth at bedtime.    Marland Kitchen levothyroxine (SYNTHROID, LEVOTHROID) 50 MCG tablet Take 50 mcg by mouth daily before breakfast.    . lovastatin (MEVACOR) 40 MG tablet Take 40 mg by mouth at bedtime.    . methocarbamol (ROBAXIN) 500 MG tablet Take 1 tablet (500 mg total) by mouth every 6 (six) hours as needed for muscle spasms. 30 tablet 0  . mometasone-formoterol (DULERA) 200-5 MCG/ACT AERO Inhale 2 puffs into the lungs daily.    . montelukast (SINGULAIR) 10 MG tablet Take 10 mg by mouth daily.  3  . oxyCODONE (OXY IR/ROXICODONE) 5 MG immediate release tablet Take 1 tablet (5 mg total) by mouth every 4 (four) hours as needed (29m for moderate pain, 159mfor severe pain). 30 tablet 0  .  SUMAtriptan (IMITREX) 50 MG tablet Take 1 tablet (50 mg total) by mouth every 2 (two) hours as needed for migraine. May repeat in 2 hours if headache persists or recurs. 12 tablet 6  . Tocilizumab 162 MG/0.9ML SOSY Inject 162 mg into the skin every 7 (seven) days.    . Marland Kitchenopiramate (TOPAMAX) 100 MG tablet Take 1 tablet (100 mg total) by mouth 2 (two) times daily. 60 tablet 11  . Triamcinolone Acetonide (TRIAMCINOLONE 0.1 % CREAM : EUCERIN) CREA Apply 1 application topically as needed for itching.     . predniSONE (DELTASONE) 1 MG tablet Take 1 tablet by mouth daily.     No facility-administered medications prior to visit.     PAST MEDICAL HISTORY: Past Medical History:  Diagnosis Date  . Arthritis   . Asthma   . Fibromyalgia   . Headache   . Hyperlipidemia   . Low back pain   . Pre-diabetes   . Temporal arteritis ([place]    PAST SURGICAL HISTORY: Past Surgical History:  Procedure Laterality Date  . ANKLE SURGERY Right   . APPENDECTOMY    . CARPAL TUNNEL RELEASE Left   . HERNIA REPAIR  04/01/14  . LAPAROTOMY N/A 11/02/2016   Procedure: EXPLORATORY LAPAROTOMY, SIGMOID COLECTOMY, COLOSTOMY;  Surgeon: GeArmandina Gemma  MD;  Location: [facility];  Service: General;  Laterality: N/A;  . ROTATOR CUFF REPAIR Left    x 2  . TEMPORAL ARTERY BIOPSY / LIGATION    . WRIST SURGERY Left     FAMILY HISTORY: Family History  Problem Relation Age of Onset  . Allergies Brother   . Irritable bowel syndrome Brother   . Asthma Sister   . Irritable bowel syndrome Sister   . Heart disease Mother   . Arthritis Mother   . Stroke Mother   . Heart attack Mother   . Heart disease Father   . Colon cancer Father   . Stroke Father   . Heart attack Father   . Other Father        Guillain-Barre    SOCIAL HISTORY: Social History   Social History  . Marital status: Married    Spouse name: N/A  . Number of children: 3  . Years of education: HS   Occupational History  . Retired    Social History Main  Topics  . Smoking status: Never Smoker  . Smokeless tobacco: Never Used  . Alcohol use No  . Drug use: No  . Sexual activity: Not on file   Other Topics Concern  . Not on file   Social History Narrative   Lives at home with husband, parents, sister and brother-in-law.   Right-handed.   No caffeine on a daily basis.     PHYSICAL EXAM  Vitals:   11/28/16 1000  BP: 108/71  Pulse: 84  Weight: 161 lb 12.8 oz (73.4 kg)  Height: 5' 1"  (1.549 m)   Body mass index is 30.57 kg/m.  Generalized: Well developed, Obese female in no acute distress  Head: normocephalic and atraumatic,. Oropharynx benign  Neck: Supple, no carotid bruits  Cardiac: Regular rate rhythm, no murmur  Musculoskeletal: No deformity   Neurological examination   Mentation: Alert oriented to time, place, history taking. Attention span and concentration appropriate. Recent and remote memory intact.  Follows all commands speech and language fluent. ESS 7  Cranial nerve II-XII: .Pupils were equal round reactive to light extraocular movements were full, visual field were full on confrontational test. Facial sensation and strength were normal. hearing was intact to finger rubbing bilaterally. Uvula tongue midline. head turning and shoulder shrug were normal and symmetric.Tongue protrusion into cheek strength was normal. Motor: normal bulk and tone, full strength in the BUE, BLE, fine finger movements normal, no pronator drift.  Sensory: normal and symmetric to light touch, pinprick, and  Vibration, in the upper and lower extremities Coordination: finger-nose-finger, heel-to-shin bilaterally, no dysmetria, no tremor Reflexes: Brachioradialis 2/2, biceps 2/2, triceps 2/2, patellar 2/2, Achilles 2/2, plantar responses were flexor bilaterally. Gait and Station: Rising up from seated position without assistance, normal stance,  moderate stride, good arm swing, smooth turning, able to perform tiptoe, and heel walking without  difficulty. Has a mild limp on the right, Boot to right foot  DIAGNOSTIC DATA (LABS, IMAGING, TESTING) - I reviewed patient records, labs, notes, testing and imaging myself where available.  Lab Results  Component Value Date   WBC 8.9 11/07/2016   HGB 16.0 (H) 11/07/2016   HCT 47.0 (H) 11/07/2016   MCV 92.9 11/07/2016   PLT 145 (L) 11/07/2016      Component Value Date/Time   NA 139 11/07/2016 0517   K 3.8 11/07/2016 0517   CL 112 (H) 11/07/2016 0517   CO2 19 (L) 11/07/2016 0517   GLUCOSE 97  11/07/2016 0517   BUN 7 11/07/2016 0517   CREATININE 0.64 11/07/2016 0517   CALCIUM 8.6 (L) 11/07/2016 0517   PROT 4.9 (L) 11/04/2016 0440   ALBUMIN 3.1 (L) 11/04/2016 0440   AST 21 11/04/2016 0440   ALT 20 11/04/2016 0440   ALKPHOS 29 (L) 11/04/2016 0440   BILITOT 0.4 11/04/2016 0440   GFRNONAA >60 11/07/2016 0517   GFRAA >60 11/07/2016 0517    ASSESSMENT AND PLAN  61 y.o. year old female  has a past medical history of Arthritis; Asthma; Fibromyalgia; Headache; Hyperlipidemia; Low back pain; Pre-diabetes; Here to follow-up for her headaches/migraines. She also has new complaint of snoring and daytime somnolence. MRI of the brain was normal   Try Topamax 19m half tab in the morning , 1/2 at  night  Imitrex 50 mg acutely MRI of the brain was normal Given information on migraine triggers specifically foods and reviewed these Will set up for sleep study/evaluation I spent 25 minutes in total face to face time with the patient more than 50% of which was spent counseling and coordination of care, reviewing test results reviewing medications and discussing and reviewing the diagnosis of migraine and avoidance of triggers. Also discussed obstructive sleep apnea that is untreated risk and ramifications see below and further treatment options. , I explained in particular the risks and ramifications of untreated moderate to severe OSA, especially with respect to cardiovascular disease  including  congestive heart failure, difficult to treat hypertension, cardiac arrhythmias, or stroke. Even type 2 diabetes has, in part, been linked to untreated OSA. Symptoms of untreated OSA include daytime sleepiness, memory problems, mood irritability and mood disorder such as depression and anxiety, lack of energy, as well as recurrent headaches, especially morning headaches. We talked about trying to maintain a healthy lifestyle in general, as well as the importance of weight control. I encouraged the patient to eat healthy, exercise daily and keep well hydrated, to keep a scheduled bedtime and wake time routine, to not skip any meals and eat healthy snacks in between meals NDennie Bible [facility] [address] STangentGParksley [place] [phone]

## 2016-11-28 ENCOUNTER — Ambulatory Visit (INDEPENDENT_AMBULATORY_CARE_PROVIDER_SITE_OTHER): Payer: BLUE CROSS/BLUE SHIELD | Admitting: Nurse Practitioner

## 2016-11-28 ENCOUNTER — Encounter: Payer: Self-pay | Admitting: Nurse Practitioner

## 2016-11-28 VITALS — BP 108/71 | HR 84 | Ht 61.0 in | Wt 161.8 lb

## 2016-11-28 DIAGNOSIS — G43709 Chronic migraine without aura, not intractable, without status migrainosus: Secondary | ICD-10-CM | POA: Diagnosis not present

## 2016-11-28 DIAGNOSIS — R0683 Snoring: Secondary | ICD-10-CM | POA: Diagnosis not present

## 2016-11-28 DIAGNOSIS — R400 Somnolence: Secondary | ICD-10-CM | POA: Diagnosis not present

## 2016-11-28 DIAGNOSIS — IMO0002 Reserved for concepts with insufficient information to code with codable children: Secondary | ICD-10-CM

## 2016-11-28 NOTE — Patient Instructions (Signed)
Try Topamax 50mg  half tab in the morning , 1/2 at  night  Imitrex 50 mg acutely MRI of the brain was normal Given information on migraine triggers specifically foods Will set up for sleep study I explained in particular the risks and ramifications of untreated moderate to severe OSA, especially with respect to cardiovascular disease  including congestive heart failure, difficult to treat hypertension, cardiac arrhythmias, or stroke. Even type 2 diabetes has, in part, been linked to untreated OSA. Symptoms of untreated OSA include daytime sleepiness, memory problems, mood irritability and mood disorder such as depression and anxiety, lack of energy, as well as recurrent headaches, especially morning headaches. We talked about trying to maintain a healthy lifestyle in general, as well as the importance of weight control. I encouraged the patient to eat healthy, exercise daily and keep well hydrated, to keep a scheduled bedtime and wake time routine, to not skip any meals and eat healthy snacks in between meals

## 2016-11-29 NOTE — Progress Notes (Signed)
I have reviewed and agreed above plan. 

## 2016-12-21 ENCOUNTER — Ambulatory Visit: Payer: Self-pay | Admitting: Surgery

## 2017-01-02 ENCOUNTER — Ambulatory Visit: Payer: BLUE CROSS/BLUE SHIELD | Admitting: Neurology

## 2017-01-02 ENCOUNTER — Encounter: Payer: Self-pay | Admitting: Neurology

## 2017-01-02 VITALS — BP 110/66 | HR 78 | Ht 61.0 in | Wt 160.5 lb

## 2017-01-02 DIAGNOSIS — Z8639 Personal history of other endocrine, nutritional and metabolic disease: Secondary | ICD-10-CM

## 2017-01-02 DIAGNOSIS — Z87898 Personal history of other specified conditions: Secondary | ICD-10-CM

## 2017-01-02 DIAGNOSIS — F5102 Adjustment insomnia: Secondary | ICD-10-CM

## 2017-01-02 DIAGNOSIS — G43711 Chronic migraine without aura, intractable, with status migrainosus: Secondary | ICD-10-CM

## 2017-01-02 DIAGNOSIS — Z72821 Inadequate sleep hygiene: Secondary | ICD-10-CM

## 2017-01-02 DIAGNOSIS — R0683 Snoring: Secondary | ICD-10-CM

## 2017-01-02 DIAGNOSIS — T380X5S Adverse effect of glucocorticoids and synthetic analogues, sequela: Secondary | ICD-10-CM

## 2017-01-02 DIAGNOSIS — T380X5A Adverse effect of glucocorticoids and synthetic analogues, initial encounter: Secondary | ICD-10-CM | POA: Insufficient documentation

## 2017-01-02 NOTE — Progress Notes (Signed)
SLEEP MEDICINE CLINIC   Provider:  Melvyn Novas, [place]  Primary Care Physician:  Macy Mis, MD   Referring Provider:  Dr. Terrace Arabia, MD   Chief Complaint  Patient presents with  . New Patient (Initial Visit)    Sleep consult. Referred by Darrol Angel, NP    HPI:  Erica Caldwell is a 61 y.o. female , seen here as in a revisit  from Erica. Terrace Arabia. Erica Caldwell is a 61 year old Caucasian female patient whose brother-in-law, Mr. Tyna Jaksch, is also my patient. She was referred by my colleague to be evaluated for a possible sleep disorder contributing to her chronic migraine condition.  I reviewed Erica. Roxy Cedar notes which indicate that the patient has been on 100 mg of topiramate twice a day which she did not tolerate it well, and she has now reduce the dose to once a day 100 mg at night, but she has now decided to use 50 mg twice daily.  This is better tolerated she feels that the medication does make her sleepy.  She is also on albuterol for asthma, takes Tylenol, Colace, Nexium, Cymbalta, Vantin, Synthroid, lovastatin, Robaxin, Dulera, Singulair, as needed oxycodone for severe pain, Imitrex for migraines.  Chief complaint according to patient : "I am sleepy and have giant cell arteritis, weaned off prednisone- got a moon face and gained a lot of weight.". "I snore, according to my husband "   Sleep habits are as follows: Patient goes to bed usually between 1 AM and 3 AM, she takes medications that do make her sleepy. The patient reports that she had trouble initiating sleep for many years maybe decades.  Even during her working years she would go to bed at 2 AM and rise again between 6 and 7 AM.  She rises now at sometimes as late as 1 or 2 PM. She sleeps on her back, after she had abdominal surgery. She uses 2-3  pillows.  The marital bedroom is described as cool but not dark, the TV is running in the background -all night. She feels that she cannot sleep well without the TV in the  background.She has infrequently nocturia , some nights not at all other nights 2 times.  She estimates sleeping on average 8 hours at night.   Sleep medical history and family sleep history:  Father had sleep apnea, uses BiPAP, brother has OSA, not compliant. Patient's father also suffers from heart disease, CHF.  Her mother had a valve replacement also has heart disease, maternal grandmother had diabetes. One brother had a valve replaced. The patient has 3 sisters and 4 brothers.  Social history: married for over 40 years, 3 adult children and 5 grandchildren. 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  She lives with her parents and in laws- caretaker.  Father has esophageal cancer at age 17.  ETOH rarely- less than 2-3 a year.Tobacco never used. Caffeine : [employer] DECA[employer] TEA,  Except soda 16 ounces or tea from [employer]- 2-3 a week. No coffee.    Erica Catalina Gravel note : " Erica Caldwell is a 61 years old right-handed female, seen in refer by her primary care doctor Casimer Lanius for evaluation of chronic migraine, initial evaluation was on August 29 2016. He was sen by Erica Terrace Arabia, who reviewed and summarized most recent referring, she had a history of arthritis, fibromyalgia, chronic low back pain, asthma, had a history of left rotator cuff repair in 2014, left wrist, right ankle surgery, temporal artery biopsy  in October 2017, there was normal. She has a long history of migraine headache, typical migraine are right lateralized severe pounding headache with associated light noise sensitivity, nauseous, lasting 2-3 days, during the headache, she often have flashing light in her visual field proceeding to the onset of headaches, per patient, she was treated with antiepileptic medication many years ago, which has helped her headache, She rarely has migraine headaches, round August 2017, she began to develop right persistent temporoparietal area low pressure pain, skin sensitivity, initially was  intermittent, but September 2017, she noticed persistent symptoms, also complains of jaw pain, hurt to chew, as soon intermittent blurry vision, she was seen by ENT, had first right temporal artery biopsy, there was normal,  Per record, January 13 2016, sensory deficit, C-reactive protein of 6," Erica Caldwell note.    Review of Systems: Out of a complete 14 system review, the patient complains of only the following symptoms, and all other reviewed systems are negative.  Interval history, the patient endorsed the feeling of decreased energy and daytime, sometimes anxiety, headaches, insomnia, daytime sleepiness and snoring, easy bruising which also may be related to her prednisone history, aching muscles and joint pain, polymyalgia rheumatica, giant cell arteritis, weight loss now after prednisone was discontinued and fatigue is still present.  Costochondritis was also endorsed, and anemia, prediabetes, migraine, anxiety, skin cancer.  He endorsed the Epworth sleepiness score at 8 points, fatigue severity at 63 points.  She endorsed the geriatric depression score at 1 out of 15 points   Social History   Socioeconomic History  . Marital status: Married    Spouse name: Not on file  . Number of children: 3  . Years of education: HS  . Highest education level: Not on file  Social Needs  . Financial resource strain: Not on file  . Food insecurity - worry: Not on file  . Food insecurity - inability: Not on file  . Transportation needs - medical: Not on file  . Transportation needs - non-medical: Not on file  Occupational History  . Occupation: Retired  Tobacco Use  . Smoking status: Never Smoker  . Smokeless tobacco: Never Used  Substance and Sexual Activity  . Alcohol use: No    Alcohol/week: 0.0 oz  . Drug use: No  . Sexual activity: Not on file  Other Topics Concern  . Not on file  Social History Narrative   Lives at home with husband, parents, sister and brother-in-law.   Right-handed.     No caffeine on a daily basis.    Family History  Problem Relation Age of Onset  . Allergies Brother   . Irritable bowel syndrome Brother   . Asthma Sister   . Irritable bowel syndrome Sister   . Heart disease Mother   . Arthritis Mother   . Stroke Mother   . Heart attack Mother   . Heart disease Father   . Colon cancer Father   . Stroke Father   . Heart attack Father   . Other Father        Guillain-Barre    Past Medical History:  Diagnosis Date  . Arthritis   . Asthma   . Fibromyalgia   . Headache   . Hyperlipidemia   . Low back pain   . Pre-diabetes   . Temporal arteritis [facility](HCC)     Past Surgical History:  Procedure Laterality Date  . ANKLE SURGERY Right   . APPENDECTOMY    . CARPAL TUNNEL RELEASE Left   .  HERNIA REPAIR  04/01/14  . LAPAROTOMY N/A 11/02/2016   Procedure: EXPLORATORY LAPAROTOMY, SIGMOID COLECTOMY, COLOSTOMY;  Surgeon: Darnell Level, MD;  Location: [facility] ORS;  Service: General;  Laterality: N/A;  . ROTATOR CUFF REPAIR Left    x 2  . TEMPORAL ARTERY BIOPSY / LIGATION    . WRIST SURGERY Left     Current Outpatient Medications  Medication Sig Dispense Refill  . acetaminophen (TYLENOL) 325 MG tablet Take 650 mg by mouth every 6 (six) hours as needed for mild pain.     Marland Kitchen albuterol (PROAIR HFA) 108 (90 BASE) MCG/ACT inhaler Inhale 2 puffs into the lungs every 6 (six) hours as needed for wheezing or shortness of breath.    . docusate sodium (COLACE) 100 MG capsule Take 100 mg by mouth daily as needed for mild constipation.    . DULoxetine (CYMBALTA) 30 MG capsule Take 30 mg by mouth daily.    Marland Kitchen esomeprazole (NEXIUM) 40 MG capsule Take 40 mg by mouth daily at 12 noon.    . gabapentin (NEURONTIN) 300 MG capsule Take 900 mg by mouth at bedtime.    Marland Kitchen levothyroxine (SYNTHROID, LEVOTHROID) 50 MCG tablet Take 50 mcg by mouth daily before breakfast.    . lovastatin (MEVACOR) 40 MG tablet Take 40 mg by mouth at bedtime.    . methocarbamol (ROBAXIN) 500 MG tablet  Take 1 tablet (500 mg total) by mouth every 6 (six) hours as needed for muscle spasms. 30 tablet 0  . mometasone-formoterol (DULERA) 200-5 MCG/ACT AERO Inhale 2 puffs into the lungs daily.    . montelukast (SINGULAIR) 10 MG tablet Take 10 mg by mouth daily.  3  . oxyCODONE (OXY IR/ROXICODONE) 5 MG immediate release tablet Take 1 tablet (5 mg total) by mouth every 4 (four) hours as needed (5mg  for moderate pain, 10mg  for severe pain). 30 tablet 0  . SUMAtriptan (IMITREX) 50 MG tablet Take 1 tablet (50 mg total) by mouth every 2 (two) hours as needed for migraine. May repeat in 2 hours if headache persists or recurs. 12 tablet 6  . topiramate (TOPAMAX) 100 MG tablet Take 1 tablet (100 mg total) by mouth 2 (two) times daily. (Patient taking differently: Take 50 mg by mouth 2 (two) times daily. ) 60 tablet 11  . Triamcinolone Acetonide (TRIAMCINOLONE 0.1 % CREAM : EUCERIN) CREA Apply 1 application topically as needed for itching.      No current facility-administered medications for this visit.     Allergies as of 01/02/2017 - Review Complete 11/28/2016  Allergen Reaction Noted  . Levaquin  [levofloxacin in d5w] Palpitations 03/04/2015  . Other Itching and Rash 04/24/2014  . Latex Hives 08/12/2014  . Penicillins Other (See Comments) 11/18/2013  . Silver Hives 05/29/2014  . Cefazolin Other (See Comments) 08/12/2014  . Chloraprep one step [chlorhexidine gluconate] Rash 08/29/2016  . Tape Rash 08/12/2014    Vitals: BP 110/66   Pulse 78   Ht 5\' 1"  (1.549 m)   Wt 160 lb 8 oz (72.8 kg)   BMI 30.33 kg/m  Last Weight:  Wt Readings from Last 1 Encounters:  01/02/17 160 lb 8 oz (72.8 kg)   ZOX:WRUE mass index is 30.33 kg/m.     Last Height:   Ht Readings from Last 1 Encounters:  01/02/17 5\' 1"  (1.549 m)    Physical exam:  General: The patient is awake, alert and appears not in acute distress. The patient is well groomed. Head: Normocephalic, atraumatic. Neck is supple. Mallampati  3     neck circumference: 14, 5. 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  Cardiovascular:  Regular rate and rhythm, without  murmurs or carotid bruit, and without distended neck veins. Respiratory: Lungs are clear to auscultation. Skin:  Without evidence of edema, or rash Trunk: BMI is 30. The patient's posture is stooped.    Neurologic exam :Speech is fluent,  with dysphonia .  Mood and affect are appropriate.  Cranial nerves: Pupils are equal and briskly reactive to light.  Extraocular movements  in vertical and horizontal planes intact and without nystagmus. Visual fields by finger perimetry are intact. Facial sensation intact to fine touch. Facial motor strength is symmetric and tongue and uvula move midline. Shoulder shrug was symmetrical.   Motor exam:   Normal tone, muscle bulk and symmetric strength in all extremities. She reports myalgia.  Sensory:  Fine touch, pinprick and vibration were normal. Coordination: Rapid alternating movements in the fingers/hands was normal.  Finger-to-nose maneuver  normal without evidence of ataxia, dysmetria or tremor. Gait and station: Patient walks without assistive device -Tandem gait deferred. Deep tendon reflexes: in the  upper and lower extremities are symmetric and intact. Babinski maneuver response is downgoing.   Assessment:  After physical and neurologic examination, review of laboratory studies,  Personal review of imaging studies, reports of other /same  Imaging studies, results of polysomnography and / or neurophysiology testing and pre-existing records as far as provided in visit., my assessment is   1) Erica Caldwell has endorsed a high level of fatigue rather than sleepiness during the day.  She has also noted that she began snoring probably related to weight gain during her high dose steroid therapy of which she has now weaned.  She has myalgia and discomfort sometimes at night, and she has long-standing insomnia  issues.  There also sleep hygiene related problems that we need to address. I like to start with evaluating her for the presence of apnea in a sleep study, should her AHI exceeds 20 I want to split study into a CPAP titration, I will also keep in mind that if the patient has only mild apnea or mainly just snoring without significant apnea, she would be a good candidate for a dental device.  She does have a significantly smaller more crowded lower jaw and could benefit from mandibular advancement.  2) we discussed today also some rules how to set a bedtime and a rise time independent of how many hours of sleep were actually accumulated in the interval, this will help to initiate sleep earlier.  She is not a daytime nap taker.  I would like for her to advance her bedtime to about midnight, I would like for her to take the TV out of the bedroom, and to rise every morning at 8 AM independent of when she went to sleep.  This should allow for a long-term solution to get 7-8 hours of sleep time.  I will print some additional advice about insomnia.  The patient was advised of the nature of the diagnosed disorder , the treatment options and the  risks for general health and wellness arising from not treating the condition.   I spent more than 45 minutes of face to face time with the patient.  Greater than 50% of time was spent in counseling and coordination of care. We have discussed the diagnosis and differential and I answered the patient's questions.    Plan:  Treatment plan and  additional workup : SPLIT PSG,   Please remember to try to maintain good sleep hygiene, which means: Keep a regular sleep and wake schedule, try not to exercise or have a meal within 2 hours of your bedtime, try to keep your bedroom conducive for sleep, that is, cool and dark, without light distractors such as an illuminated alarm clock, and refrain from watching TV right before sleep or in the middle of the night and do not keep the  TV or radio on during the night. Also, try not to use or play on electronic devices at bedtime, such as your cell phone, tablet PC or laptop. If you like to read at bedtime on an electronic device, try to dim the background light as much as possible. Do not eat in the middle of the night.   We will request a sleep study.    We will look for leg twitching and snoring or sleep apnea.   For chronic insomnia, you are best followed by a psychiatrist and/or sleep psychologist.   We will call you with the sleep study results and make a follow up appointment if needed.     Melvyn Novas, MD 01/02/2017, 3:06 PM  Certified in Neurology by ABPN Certified in Sleep Medicine by [facility] [address], [address] [place], [place] [address]

## 2017-01-02 NOTE — Patient Instructions (Signed)
Please remember to try to maintain good sleep hygiene, which means: Keep a regular sleep and wake schedule, try not to exercise or have a meal within 2 hours of your bedtime, try to keep your bedroom conducive for sleep, that is, cool and dark, without light distractors such as an illuminated alarm clock, and refrain from watching TV right before sleep or in the middle of the night and do not keep the TV or radio on during the night. Also, try not to use or play on electronic devices at bedtime, such as your cell phone, tablet PC or laptop. If you like to read at bedtime on an electronic device, try to dim the background light as much as possible. Do not eat in the middle of the night.   We will request a sleep study.    We will look for leg twitching and snoring or sleep apnea.   For chronic insomnia, you are best followed by a psychiatrist and/or sleep psychologist.   We will call you with the sleep study results and make a follow up appointment if needed.   Insomnia Insomnia is a sleep disorder that makes it difficult to fall asleep or to stay asleep. Insomnia can cause tiredness (fatigue), low energy, difficulty concentrating, mood swings, and poor performance at work or school. There are three different ways to classify insomnia:  Difficulty falling asleep.  Difficulty staying asleep.  Waking up too early in the morning.  Any type of insomnia can be long-term (chronic) or short-term (acute). Both are common. Short-term insomnia usually lasts for three months or less. Chronic insomnia occurs at least three times a week for longer than three months. What are the causes? Insomnia may be caused by another condition, situation, or substance, such as:  Anxiety.  Certain medicines.  Gastroesophageal reflux disease (GERD) or other gastrointestinal conditions.  Asthma or other breathing conditions.  Restless legs syndrome, sleep apnea, or other sleep disorders.  Chronic  pain.  Menopause. This may include hot flashes.  Stroke.  Abuse of alcohol, tobacco, or illegal drugs.  Depression.  Caffeine.  Neurological disorders, such as Alzheimer disease.  An overactive thyroid (hyperthyroidism).  The cause of insomnia may not be known. What increases the risk? Risk factors for insomnia include:  Gender. Women are more commonly affected than men.  Age. Insomnia is more common as you get older.  Stress. This may involve your professional or personal life.  Income. Insomnia is more common in people with lower income.  Lack of exercise.  Irregular work schedule or night shifts.  Traveling between different time zones.  What are the signs or symptoms? If you have insomnia, trouble falling asleep or trouble staying asleep is the main symptom. This may lead to other symptoms, such as:  Feeling fatigued.  Feeling nervous about going to sleep.  Not feeling rested in the morning.  Having trouble concentrating.  Feeling irritable, anxious, or depressed.  How is this treated? Treatment for insomnia depends on the cause. If your insomnia is caused by an underlying condition, treatment will focus on addressing the condition. Treatment may also include:  Medicines to help you sleep.  Counseling or therapy.  Lifestyle adjustments.  Follow these instructions at home:  Take medicines only as directed by your health care provider.  Keep regular sleeping and waking hours. Avoid naps.  Keep a sleep diary to help you and your health care provider figure out what could be causing your insomnia. Include: ? When you sleep. ?   When you wake up during the night. ? How well you sleep. ? How rested you feel the next day. ? Any side effects of medicines you are taking. ? What you eat and drink.  Make your bedroom a comfortable place where it is easy to fall asleep: ? Put up shades or special blackout curtains to block light from outside. ? Use a  white noise machine to block noise. ? Keep the temperature cool.  Exercise regularly as directed by your health care provider. Avoid exercising right before bedtime.  Use relaxation techniques to manage stress. Ask your health care provider to suggest some techniques that may work well for you. These may include: ? Breathing exercises. ? Routines to release muscle tension. ? Visualizing peaceful scenes.  Cut back on alcohol, caffeinated beverages, and cigarettes, especially close to bedtime. These can disrupt your sleep.  Do not overeat or eat spicy foods right before bedtime. This can lead to digestive discomfort that can make it hard for you to sleep.  Limit screen use before bedtime. This includes: ? Watching TV. ? Using your smartphone, tablet, and computer.  Stick to a routine. This can help you fall asleep faster. Try to do a quiet activity, brush your teeth, and go to bed at the same time each night.  Get out of bed if you are still awake after 15 minutes of trying to sleep. Keep the lights down, but try reading or doing a quiet activity. When you feel sleepy, go back to bed.  Make sure that you drive carefully. Avoid driving if you feel very sleepy.  Keep all follow-up appointments as directed by your health care provider. This is important. Contact a health care provider if:  You are tired throughout the day or have trouble in your daily routine due to sleepiness.  You continue to have sleep problems or your sleep problems get worse. Get help right away if:  You have serious thoughts about hurting yourself or someone else. This information is not intended to replace advice given to you by your health care provider. Make sure you discuss any questions you have with your health care provider. Document Released: 01/15/2000 Document Revised: 06/19/2015 Document Reviewed: 10/18/2013 Elsevier Interactive Patient Education  2018 [employer]  

## 2017-01-02 NOTE — Addendum Note (Signed)
Addended by: Melvyn NovasHMEIER, Kyrin Garn on: 01/02/2017 03:38 PM   Modules accepted: Orders

## 2017-01-19 ENCOUNTER — Telehealth: Payer: Self-pay | Admitting: Neurology

## 2017-01-19 NOTE — Telephone Encounter (Signed)
We have attempted to call the patient 2 times to schedule sleep study. Patient has been unavailable at the phone numbers we have on file and has not returned our calls. At this point we will send a letter asking pt to please contact the sleep lab to schedule their sleep study. If patient calls back we will schedule them for their sleep study. ° °

## 2017-02-24 NOTE — Patient Instructions (Signed)
Erica JourneyRosemary Caldwell  02/24/2017   Your procedure is scheduled on: 03-02-17   Report to [facility] Main  Entrance Report to Admitting at 10:00 AM   Call this number if you have problems the morning of surgery [phone]   Remember: Do not eat food or drink liquids :After Midnight.     Take these medicines the morning of surgery with A SIP OF WATER: Duloxetine (Cymbalta), Levothyroxine (Synthroid), and Montelukast (Singular). You may also bring and use your inhaler.                                You may not have any metal on your body including hair pins and              piercings  Do not wear jewelry, make-up, lotions, powders or perfumes, deodorant             Do not wear nail polish.  Do not shave  48 hours prior to surgery.                Do not bring valuables to the hospital. [facility] IS NOT             RESPONSIBLE   FOR VALUABLES.  Contacts, dentures or bridgework may not be worn into surgery.  Leave suitcase in the car. After surgery it may be brought to your room.     Special Instructions: Please follow the bowel/rectal prep instruction as indicated by your doctor.              Please read over the following fact sheets you were given: _____________________________________________________________________             [facility] - Preparing for [facility] Before surgery, you can play an important role.  Because skin is not sterile, your skin needs to be as free of germs as possible.  You can reduce the number of germs on your skin by washing with CHG (chlorahexidine gluconate) soap before surgery.  CHG is an antiseptic cleaner which kills germs and bonds with the skin to continue killing germs even after washing. Please DO NOT use if you have an allergy to CHG or antibacterial soaps.  If your skin becomes reddened/irritated stop using the CHG and inform your nurse when you arrive at Short Stay. Do not shave (including legs and underarms) for at least  48 hours prior to the first CHG shower.  You may shave your face/neck. Please follow these instructions carefully:  1.  Shower with CHG Soap the night before surgery and the  morning of Surgery.  2.  If you choose to wash your hair, wash your hair first as usual with your  normal  shampoo.  3.  After you shampoo, rinse your hair and body thoroughly to remove the  shampoo.                           4.  Use CHG as you would any other liquid soap.  You can apply chg directly  to the skin and wash                       Gently with a scrungie or clean washcloth.  5.  Apply the CHG Soap to your body ONLY FROM THE  NECK DOWN.   Do not use on face/ open                           Wound or open sores. Avoid contact with eyes, ears mouth and genitals (private parts).                       Wash face,  Genitals (private parts) with your normal soap.             6.  Wash thoroughly, paying special attention to the area where your surgery  will be performed.  7.  Thoroughly rinse your body with warm water from the neck down.  8.  DO NOT shower/wash with your normal soap after using and rinsing off  the CHG Soap.                9.  Pat yourself dry with a clean towel.            10.  Wear clean pajamas.            11.  Place clean sheets on your bed the night of your first shower and do not  sleep with pets. Day of Surgery : Do not apply any lotions/deodorants the morning of surgery.  Please wear clean clothes to the hospital/surgery center.  FAILURE TO FOLLOW THESE INSTRUCTIONS MAY RESULT IN THE CANCELLATION OF YOUR SURGERY PATIENT SIGNATURE_________________________________  NURSE SIGNATURE__________________________________  ________________________________________________________________________

## 2017-02-24 NOTE — Progress Notes (Signed)
07-09-16 EKG Chart everywhere   08-25-16  CXR Chart everywhere

## 2017-02-27 ENCOUNTER — Encounter (HOSPITAL_COMMUNITY)
Admission: RE | Admit: 2017-02-27 | Discharge: 2017-02-27 | Disposition: A | Discharge status: Home / Self Care | Payer: BLUE CROSS/BLUE SHIELD | Source: Ambulatory Visit | Attending: Family Medicine | Admitting: Family Medicine

## 2017-02-27 NOTE — Progress Notes (Signed)
[facility]  PATIENT: Erica Caldwell DOB: 05/20/1955   REASON FOR VISIT: Follow-up for migraine,  daytime somnolence HISTORY FROM: Patient    HISTORY OF PRESENT ILLNESS:08/29/16 Erica Caldwell is a 62 years old right-handed female, seen in refer by her primary care doctor Erica Caldwell for evaluation of chronic migraine, initial evaluation was on August 29 2016.  I reviewed and summarized most recent referring, she had a history of arthritis, fibromyalgia, chronic low back pain, asthma, had a history of left rotator cuff repair in 2014, left wrist, right ankle surgery, temporal artery biopsy in October 2017, there was normal.  She has a long history of migraine headache, typical migraine are right lateralized severe pounding headache with associated light noise sensitivity, nauseous, lasting 2-3 days, during the headache, she often have flashing light in her visual field proceeding to the onset of headaches, per patient, she was treated with antiepileptic medication many years ago, which has helped her headache,  She rarely has migraine headaches, round August 2017, she began to develop right persistent temporoparietal area low pressure pain, skin sensitivity, initially was intermittent, but September 2017, she noticed persistent symptoms, also complains of jaw pain, hurt to chew, as soon intermittent blurry vision, she was seen by ENT, had first right temporal artery biopsy, there was normal,  Per record, January 13 2016, sensory deficit, C-reactive protein of 6,  Clinically she was highly suspicious for joints cell arteritis, carotid Dopplers showed no significant stenosis, she was started on prednisone 60 mg daily in December 2017, complains of significant side effect including increased blood pressure, feeling anxious, palpitation, was lowered to 40 mg daily in 2 weeks, her symptoms improved and resolved but she continue have side effect, then prednisone dose  was continued tapered off the lower dose, while she was taking prednisone 20 mg daily by April 06 2016, she noticed recurrent right temporal headaches, jaw claudication, right skull sensitivity, the prednisone dosage was increased to 25 mg daily, noticed improvement of her symptoms,   Over the last few months, her prednisone was continued tapering to 3 mg on Jun 22 2016, she continue complains of low-grade right-sided pressure headache, skin sensitivity, she was recently treated with higher dose of prednisone 40 mg daily because of her upper respiratory symptoms, she denies significant improvement,  She also tried over-the-counter Tylenol, NSAIDs for headache with limited help, she did has diffuse body achy pain, fibromyalgia,  Laboratory evaluations in July 2018, normal C-reactive protein 0.5, mild elevated WBC 11.2, normal hemoglobin of 15, CMP with glucose of 109, normal ESR,  UPDATE 10/29/2018CM Erica Caldwell, 62 year old female returns for follow-up with history of migraines. When initially evaluated by Erica Caldwell she was placed on 100 mg of Topamax twice a day however she had side effects to the medication and she is currently just taking 100 mg at night. She continues to have headaches complains of daytime somnolence and snoring. She had recent Surgery for diverticulitis in October and has a colostomy bag and an  wound on her stomach. He claims she is under a lot of stress, recently found out that her father has cancer. She has not tried the Imitrex acutely. She is not aware of any migraine triggers except weather and stress. She returns for reevaluation UPDATE 1/29/2019CM Erica Caldwell, 62 year old female returns for follow-up with history of migraine headaches which are in fair control at present.  She is currently on Topamax 50 twice daily and she has never taken her Imitrex acutely.  When  last seen due to her daytime drowsiness a sleep evaluation was ordered however she has been on hold until she have  her colostomy reversed the end of next month.  Her father recently died and the funeral is this 04-02-22 ,she had been under a lot of stress.  She does note that stress and weather changes are migraine triggers for her.  She walks for exercise.  She returns for reevaluation REVIEW OF SYSTEMS: Full 14 system review of systems performed and notable only for those listed, all others are neg:  Constitutional: Fatigue Cardiovascular: neg Ear/Nose/Throat: neg  Skin: neg Eyes: neg Respiratory: neg Gastroitestinal: neg  Hematology/Lymphatic: neg  Endocrine: Intolerance to heat and cold Musculoskeletal: Back pain, fibromyalgia Allergy/Immunology: Food allergies  Neurological: Headache Psychiatric: Anxiety Sleep : neg   ALLERGIES: Allergies  Allergen Reactions  . Levaquin  [Levofloxacin In D5w] Palpitations  . Other Itching and Rash    Perfume- shortness of breath; watery eyes; headache  . Latex Hives  . Penicillins Other (See Comments)    Unsure of reaction Has patient had a PCN reaction causing immediate rash, facial/tongue/throat swelling, SOB or lightheadedness with hypotension: Unknown Has patient had a PCN reaction causing severe rash involving mucus membranes or skin necrosis: Unknown Has patient had a PCN reaction that required hospitalization: Unknown Has patient had a PCN reaction occurring within the last 10 years: Yes If all of the above answers are "NO", then may proceed with Cephalosporin use.   Erica Caldwell Hives    Patient complains of blisters/itching all over body.   Erica Kitchen Blueberry Caldwell Swelling    Tongue swells and throat itchy   . Cefazolin Other (See Comments)    Unknown   . Lactose Intolerance (Gi) Nausea And Vomiting  . Chloraprep One Step [Chlorhexidine Gluconate] Rash  . Tape Rash    Including tegaderm    HOME MEDICATIONS: Outpatient Medications Prior to Visit  Medication Sig Dispense Refill  . acetaminophen (TYLENOL) 500 MG tablet Take 500 mg by mouth every 6  (six) hours as needed for mild pain.    Erica Kitchen albuterol (PROAIR HFA) 108 (90 BASE) MCG/ACT inhaler Inhale 2 puffs into the lungs every 6 (six) hours as needed for wheezing or shortness of breath.    . docusate sodium (COLACE) 100 MG capsule Take 100 mg by mouth daily as needed for mild constipation.    . DULoxetine (CYMBALTA) 30 MG capsule Take 30 mg by mouth daily.    Erica Kitchen esomeprazole (NEXIUM) 40 MG capsule Take 40 mg by mouth daily at 12 noon.    . gabapentin (NEURONTIN) 300 MG capsule Take 900 mg by mouth at bedtime.    Erica Kitchen levothyroxine (SYNTHROID, LEVOTHROID) 50 MCG tablet Take 50 mcg by mouth daily before breakfast.    . lovastatin (MEVACOR) 40 MG tablet Take 40 mg by mouth every evening.     . methocarbamol (ROBAXIN) 500 MG tablet Take 1 tablet (500 mg total) by mouth every 6 (six) hours as needed for muscle spasms. (Patient taking differently: Take 500 mg by mouth every 6 (six) hours as needed for muscle spasms. ) 30 tablet 0  . mometasone-formoterol (DULERA) 200-5 MCG/ACT AERO Inhale 2 puffs into the lungs daily.    . montelukast (SINGULAIR) 10 MG tablet Take 10 mg by mouth daily.  3  . oxyCODONE (OXY IR/ROXICODONE) 5 MG immediate release tablet Take 1 tablet (5 mg total) by mouth every 4 (four) hours as needed (28m for moderate pain, 129mfor severe pain). 30[place]  tablet 0  . SUMAtriptan (IMITREX) 50 MG tablet Take 1 tablet (50 mg total) by mouth every 2 (two) hours as needed for migraine. May repeat in 2 hours if headache persists or recurs. 12 tablet 6  . topiramate (TOPAMAX) 100 MG tablet Take 1 tablet (100 mg total) by mouth 2 (two) times daily. (Patient taking differently: Take 50 mg by mouth 2 (two) times daily. ) 60 tablet 11  . Triamcinolone Acetonide (TRIAMCINOLONE 0.1 % CREAM : EUCERIN) CREA Apply 1 application topically as needed for itching.      No facility-administered medications prior to visit.     PAST MEDICAL HISTORY: Past Medical History:  Diagnosis Date  . Arthritis   . Asthma    . Fibromyalgia   . Headache   . Hyperlipidemia   . Low back pain   . Pre-diabetes   . Temporal arteritis ([place])     PAST SURGICAL HISTORY: Past Surgical History:  Procedure Laterality Date  . ANKLE SURGERY Right   . APPENDECTOMY    . CARPAL TUNNEL RELEASE Left   . HERNIA REPAIR  04/01/14  . LAPAROTOMY N/A 11/02/2016   Procedure: EXPLORATORY LAPAROTOMY, SIGMOID COLECTOMY, COLOSTOMY;  Surgeon: Armandina Gemma, MD;  Location: [facility];  Service: General;  Laterality: N/A;  . ROTATOR CUFF REPAIR Left    x 2  . TEMPORAL ARTERY BIOPSY / LIGATION    . WRIST SURGERY Left     FAMILY HISTORY: Family History  Problem Relation Age of Onset  . Allergies Brother   . Irritable bowel syndrome Brother   . Asthma Sister   . Irritable bowel syndrome Sister   . Heart disease Mother   . Arthritis Mother   . Stroke Mother   . Heart attack Mother   . Heart disease Father   . Colon cancer Father   . Stroke Father   . Heart attack Father   . Other Father        Guillain-Barre    SOCIAL HISTORY: Social History   Socioeconomic History  . Marital status: Married    Spouse name: Not on file  . Number of children: 3  . Years of education: HS  . Highest education level: Not on file  Social Needs  . Financial resource strain: Not on file  . Food insecurity - worry: Not on file  . Food insecurity - inability: Not on file  . Transportation needs - medical: Not on file  . Transportation needs - non-medical: Not on file  Occupational History  . Occupation: Retired  Tobacco Use  . Smoking status: Never Smoker  . Smokeless tobacco: Never Used  Substance and Sexual Activity  . Alcohol use: No    Alcohol/week: 0.0 oz  . Drug use: No  . Sexual activity: Not on file  Other Topics Concern  . Not on file  Social History Narrative   Lives at home with husband, parents, sister and brother-in-law.   Right-handed.   No caffeine on a daily basis.     PHYSICAL EXAM  Vitals:   02/28/17 1303    BP: 94/60  Pulse: 76  Weight: 153 lb 9.6 oz (69.7 kg)   Body mass index is 29.02 kg/m.  Generalized: Well developed, Obese female in no acute distress  Head: normocephalic and atraumatic,. Oropharynx benign  Neck: Supple,  Musculoskeletal: No deformity   Neurological examination   Mentation: Alert oriented to time, place, history taking. Attention span and concentration appropriate. Recent and remote memory intact.  Follows  all commands speech and language fluent.   Cranial nerve II-XII: .Pupils were equal round reactive to light extraocular movements were full, visual field were full on confrontational test. Facial sensation and strength were normal. hearing was intact to finger rubbing bilaterally. Uvula tongue midline. head turning and shoulder shrug were normal and symmetric.Tongue protrusion into cheek strength was normal. Motor: normal bulk and tone, full strength in the BUE, BLE, fine finger movements normal, no pronator drift.  Sensory: normal and symmetric to light touch, pinprick, and  Vibration, in the upper and lower extremities Coordination: finger-nose-finger, heel-to-shin bilaterally, no dysmetria, no tremor Reflexes: Brachioradialis 2/2, biceps 2/2, triceps 2/2, patellar 2/2, Achilles 2/2, plantar responses were flexor bilaterally. Gait and Station: Rising up from seated position without assistance, normal stance,  moderate stride, good arm swing, smooth turning, able to perform tiptoe, and heel walking without difficulty. Has a mild limp on the right, no assistive device  DIAGNOSTIC DATA (LABS, IMAGING, TESTING) - I reviewed patient records, labs, notes, testing and imaging myself where available.  Lab Results  Component Value Date   WBC 8.9 11/07/2016   HGB 16.0 (H) 11/07/2016   HCT 47.0 (H) 11/07/2016   MCV 92.9 11/07/2016   PLT 145 (L) 11/07/2016      Component Value Date/Time   NA 139 11/07/2016 0517   K 3.8 11/07/2016 0517   CL 112 (H) 11/07/2016 0517    CO2 19 (L) 11/07/2016 0517   GLUCOSE 97 11/07/2016 0517   BUN 7 11/07/2016 0517   CREATININE 0.64 11/07/2016 0517   CALCIUM 8.6 (L) 11/07/2016 0517   PROT 4.9 (L) 11/04/2016 0440   ALBUMIN 3.1 (L) 11/04/2016 0440   AST 21 11/04/2016 0440   ALT 20 11/04/2016 0440   ALKPHOS 29 (L) 11/04/2016 0440   BILITOT 0.4 11/04/2016 0440   GFRNONAA >60 11/07/2016 0517   GFRAA >60 11/07/2016 0517    ASSESSMENT AND PLAN  62 y.o. year old female  has a past medical history of Arthritis; Asthma; Fibromyalgia; Headache; Hyperlipidemia; Low back pain; Pre-diabetes; Here to follow-up for her headaches/migraines. She  has complaint of snoring and daytime somnolence. MRI of the brain was normal.  She is holding off on her sleep evaluation until after her colostomy is reversed next month   PLAN: Continue Topamax 48m BID Imitrex 50 mg acutely Will call to set up sleep evaluation after colostomy reversal. F/U with me 6 to 8 months NDennie Bible [facility] APRN  [facility] [address] SS[place]ort [place] [phone]

## 2017-02-28 ENCOUNTER — Encounter (INDEPENDENT_AMBULATORY_CARE_PROVIDER_SITE_OTHER): Payer: Self-pay

## 2017-02-28 ENCOUNTER — Encounter: Payer: Self-pay | Admitting: Nurse Practitioner

## 2017-02-28 ENCOUNTER — Ambulatory Visit: Payer: BLUE CROSS/BLUE SHIELD | Admitting: Nurse Practitioner

## 2017-02-28 VITALS — BP 94/60 | HR 76 | Wt 153.6 lb

## 2017-02-28 DIAGNOSIS — G43709 Chronic migraine without aura, not intractable, without status migrainosus: Secondary | ICD-10-CM

## 2017-02-28 DIAGNOSIS — R0683 Snoring: Secondary | ICD-10-CM | POA: Diagnosis not present

## 2017-02-28 DIAGNOSIS — R400 Somnolence: Secondary | ICD-10-CM

## 2017-02-28 DIAGNOSIS — IMO0002 Reserved for concepts with insufficient information to code with codable children: Secondary | ICD-10-CM

## 2017-02-28 NOTE — Patient Instructions (Signed)
Continue Topamax 50mg  BID Imitrex 50 mg acutely Will call to set up sleep evaluation after colostomy reversal. F/U with me 6 to 8 months

## 2017-03-02 NOTE — Progress Notes (Signed)
I have reviewed and agreed above plan. 

## 2017-03-10 NOTE — Patient Instructions (Addendum)
Erica Caldwell  03/10/2017   Your procedure is scheduled on: 03/23/2017   Report to [facility] Main  Entrance  Report to admitting at    1000 AM   Call this number if you have problems the morning of surgery [phone]   Remember: Do not eat food or drink liquids :After Midnight.            Follow Bowel Prep Instructions per MD.   DRINK 2 PRESURGERY ENSURE DRINKS THE NIGHT BEFORE SURGERY AT  1000 PM AND 1 PRESURGERY DRINK THE DAY OF THE PROCEDURE 3 HOURS PRIOR TO SCHEDULED SURGERY. NO SOLINDS AFTER MIDNIGHT THE DAY PRIOR TO THE SURGERY. NOTHING BY MOUTH EXCEPT CLEAR LIQUIDS UNTIL THREE HOURS PRIOR TO SCHEDULED SURGERY. PLEASE FINISH PRESURGERY ENSURE DRINK PER SURGEON ORDER 3 HOURS PRIOR TO SCHEDULED SURGERY TIME WHICH NEEDS TO BE COMPLETED AT 900 AM.    CLEAR LIQUID DIET   Foods Allowed                                                                     Foods Excluded  Coffee and tea, regular and decaf                             liquids that you cannot  Plain Jell-O in any flavor                                             see through such as: Fruit ices (not with fruit pulp)                                     milk, soups, orange juice  Iced Popsicles                                    All solid food Carbonated beverages, regular and diet                                    Cranberry, grape and apple juices Sports drinks like Gatorade Lightly seasoned clear broth or consume(fat free) Sugar, honey syrup  Sample Menu Breakfast                                Lunch                                     Supper Cranberry juice                    Beef broth  Chicken broth Jell-O                                     Grape juice                           Apple juice Coffee or tea                        Jell-O                                      Popsicle                                                Coffee or tea                         Coffee or tea  _____________________________________________________________________     Take these medicines the morning of surgery with A SIP OF WATER: Inhalers as usual and bring, Cymbalta, Nexium, Synthroid, Topamax.                               You may not have any metal on your body including hair pins and              piercings  Do not wear jewelry, make-up, lotions, powders or perfumes, deodorant             Do not wear nail polish.  Do not shave  48 hours prior to surgery.     Do not bring valuables to the hospital. [place] IS NOT             RESPONSIBLE   FOR VALUABLES.  Contacts, dentures or bridgework may not be worn into surgery.  Leave suitcase in the car. After surgery it may be brought to your room.                      Please read over the following fact sheets you were given: _____________________________________________________________________             [facility] - Pre[facility] Before surgery, you can play an important role.  Because skin is not sterile, your skin needs to be as free of germs as possible.  You can reduce the number of germs on your skin by washing with DIAL SOAP(chlorahexidine gluconate) soap before surgery.  CHG is an antiseptic cleaner which kills germs and bonds with the skin to continue killing germs even after washing. Please DO NOT use if you have an allergy to CHG or antibacterial soaps.  If your skin becomes reddened/irritated stop using the CHG and inform your nurse when you arrive at Short Stay. .  You may shave your face/neck. Please follow these instructions carefully:  1.  Shower with DIAL SOAP the night before surgery and the  morning of Surgery.  2.  If you choose to wash your hair, wash your hair first as usual with your  normal  shampoo.  3.  After you shampoo, rinse your  hair and body thoroughly to remove the  shampoo.                                                Gently with a scrungie or clean washcloth.                        Wash face,  Genitals (private parts) with your normal soap.             6.  Wash thoroughly, paying special attention to the area where your surgery  will be performed.  7.  Thoroughly rinse your body with warm water from the neck down.                 9.  Pat yourself dry with a clean towel.            10.  Wear clean pajamas.            11.  Place clean sheets on your bed the night of your first shower and do not  sleep with pets. Day of Surgery : Do not apply any lotions/deodorants the morning of surgery.  Please wear clean clothes to the hospital/surgery center.  FAILURE TO FOLLOW THESE INSTRUCTIONS MAY RESULT IN THE CANCELLATION OF YOUR SURGERY PATIENT SIGNATURE_________________________________  NURSE SIGNATURE__________________________________  ________________________________________________________________________

## 2017-03-13 ENCOUNTER — Encounter (HOSPITAL_COMMUNITY)
Admission: RE | Admit: 2017-03-13 | Discharge: 2017-03-13 | Disposition: A | Discharge status: Home / Self Care | Payer: BLUE CROSS/BLUE SHIELD | Source: Ambulatory Visit | Attending: Surgery | Admitting: Surgery

## 2017-03-13 ENCOUNTER — Other Ambulatory Visit: Payer: Self-pay

## 2017-03-13 ENCOUNTER — Encounter (HOSPITAL_COMMUNITY): Payer: Self-pay

## 2017-03-13 DIAGNOSIS — Z933 Colostomy status: Secondary | ICD-10-CM | POA: Diagnosis not present

## 2017-03-13 DIAGNOSIS — Z01818 Encounter for other preprocedural examination: Secondary | ICD-10-CM | POA: Insufficient documentation

## 2017-03-13 DIAGNOSIS — K5790 Diverticulosis of intestine, part unspecified, without perforation or abscess without bleeding: Secondary | ICD-10-CM | POA: Diagnosis not present

## 2017-03-13 DIAGNOSIS — E119 Type 2 diabetes mellitus without complications: Secondary | ICD-10-CM | POA: Diagnosis not present

## 2017-03-13 HISTORY — DX: Other complications of anesthesia, initial encounter: T88.59XA

## 2017-03-13 HISTORY — DX: Adverse effect of unspecified anesthetic, initial encounter: T41.45XA

## 2017-03-13 LAB — CBC
HCT: 44.7 % (ref 36.0–46.0)
HEMOGLOBIN: 14.1 g/dL (ref 12.0–15.0)
MCH: 29.4 pg (ref 26.0–34.0)
MCHC: 31.5 g/dL (ref 30.0–36.0)
MCV: 93.3 fL (ref 78.0–100.0)
Platelets: 216 10*3/uL (ref 150–400)
RBC: 4.79 MIL/uL (ref 3.87–5.11)
RDW: 14.7 % (ref 11.5–15.5)
WBC: 8.9 10*3/uL (ref 4.0–10.5)

## 2017-03-13 LAB — BASIC METABOLIC PANEL
ANION GAP: 6 (ref 5–15)
BUN: 9 mg/dL (ref 6–20)
CALCIUM: 9.6 mg/dL (ref 8.9–10.3)
CO2: 22 mmol/L (ref 22–32)
Chloride: 117 mmol/L — ABNORMAL HIGH (ref 101–111)
Creatinine, Ser: 0.86 mg/dL (ref 0.44–1.00)
GFR calc Af Amer: >60 mL/min (ref >60)
GLUCOSE: 100 mg/dL — AB (ref 65–99)
Potassium: 4 mmol/L (ref 3.5–5.1)
SODIUM: 145 mmol/L (ref 135–145)

## 2017-03-13 LAB — HEMOGLOBIN A1C
HEMOGLOBIN A1C: 5.9 % — AB (ref 4.8–5.6)
MEAN PLASMA GLUCOSE: 122.63 mg/dL

## 2017-03-19 ENCOUNTER — Encounter (HOSPITAL_COMMUNITY): Payer: Self-pay | Admitting: Surgery

## 2017-03-19 DIAGNOSIS — Z8719 Personal history of other diseases of the digestive system: Secondary | ICD-10-CM

## 2017-03-19 DIAGNOSIS — Z933 Colostomy status: Secondary | ICD-10-CM

## 2017-03-19 NOTE — H&P (Signed)
General Surgery [facility]  Erica Caldwell DOB: [birth year] Married / Language: English / Race: White Female   History of Present Illness  The patient is a 62 year old female presenting for a post-operative visit.  CC: follow up colostomy, open wound  Patient returns for follow-up having undergone Hartman's resection for perforation. She continues to do twice daily dressing changes to her midline abdominal wound. She is anxious to proceed with colostomy closure as soon as possible. We did obtain her colonoscopy records from [place] and she does not require repeat colonoscopy prior to colostomy closure.   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 Foot Surgery  Right. Gallbladder Surgery - Laparoscopic  Shoulder Surgery  Left. Thyroid Surgery  Tonsillectomy  Ventral / Umbilical Hernia Surgery  Bilateral.  Diagnostic Studies History Colonoscopy  within last year Mammogram  within last year Pap Smear  1-5 years ago  Allergies  Penicillin G Potassium *PENICILLINS*  Levamisole HCl *ANTINEOPLASTICS AND ADJUNCTIVE THERAPIES*  CeFAZolin in Sodium Chloride *CEPHALOSPORINS*  ChloraPrep One Step *ANTISEPTICS & DISINFECTANTS*  Latex  Allergies Reconciled   Medication History Ventolin HFA (108 (90 Base)MCG/ACT Aerosol Soln, Inhalation) Active. Topiramate (100MG  Tablet, Oral) Active. TiZANidine HCl (4MG  Tablet, Oral) Active. PredniSONE (10MG  Tablet, Oral) Active. Methocarbamol (500MG  Tablet, Oral) Active. Levothyroxine Sodium ( Tablet, Oral) Active. Gabapentin (300MG  Capsule, Oral) Active. Lovastatin (40MG  Tablet, Oral) Active. Esomeprazole Magnesium (40MG  Capsule DR, Oral) Active. OxyCODONE HCl (5MG  Tablet, Oral) Active. Hydrocodone-Acetaminophen (7.5-325MG  Tablet, Oral) Active. Levothyroxine Sodium  ( Tablet, Oral) Active. MetFORMIN HCl ER (500MG  Tablet ER 24HR, Oral) Active. Montelukast Sodium (10MG  Tablet, Oral) Active. DULoxetine HCl (30MG  Capsule DR Part, Oral) Active. Vitamin C (500MG  Tablet, Oral) Active. Medications Reconciled  Social History Alcohol use  Occasional alcohol use. Caffeine use  Carbonated beverages, Tea. No drug use  Tobacco use  Never smoker.  Family History  Arthritis  Father, Mother. Breast Cancer  Family Members In General. Colon Cancer  Father. Colon Polyps  Brother. Heart Disease  Brother, Family Members In General, Father, Mother. Hypertension  Father, Mother. Kidney Disease  Father.  Pregnancy / Birth History Age at menarche  12 years. Age of menopause  51-50 Gravida  2 Maternal age  60-25 Para  3  Other Problems Anxiety Disorder  Arthritis  Asthma  Back Pain  Chest pain  Diverticulosis  Gastric Ulcer  Gastroesophageal Reflux Disease  General anesthesia - complications  Hemorrhoids  Melanoma  Migraine Headache  Other disease, cancer, significant illness  Thyroid Disease  Umbilical Hernia Repair   Vitals Weight: 160.8 lb Height: 61in Body Surface Area: 1.72 m Body Mass Index: 30.38 kg/m  Temp.: 97.35F(Temporal)  Pulse: 81 (Regular)  BP: 118/72 (Sitting, Left Arm, Standard)  Physical Exam  Limited examination  Midline abdominal incision is now nearly completely epithelialized. It is quite superficial. Wet to dry dressings are removed. Triple antibiotic ointment is applied to the wound and covered with a light gauze dressing.    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 They may discontinue wet-to-dry dressings at this time. I have recommended store brand triple antibiotic ointment be applied to the wound 2-3 times daily. They should cover the wound with light  gauze. She may shower the wound once daily. After the wound has completely epithelialized, they should discontinue antibiotic  ointment and use cocoa butter cream.  Patient is anxious to proceed with colostomy closure. I have reviewed her outside colonoscopy report and she does not require a repeat colonoscopy. We will plan for surgery for colostomy takedown in late January or early February 2019. Patient will meet with the schedulers today to begin working on a date. We discussed the procedure and the hospital stay to be anticipated. We discussed the location and size of the surgical wounds and the possible need for continued dressing changes. Patient understands and wishes to proceed.  The risks and benefits of the procedure have been discussed at length with the patient. The patient understands the proposed procedure, potential alternative treatments, and the course of recovery to be expected. All of the patient's questions have been answered at this time. The patient wishes to proceed with surgery.  Darnell Levelodd Matison Nuccio, MD [facility] Office: [phone]

## 2017-03-23 ENCOUNTER — Inpatient Hospital Stay (HOSPITAL_COMMUNITY): Payer: BLUE CROSS/BLUE SHIELD | Admitting: Registered Nurse

## 2017-03-23 ENCOUNTER — Encounter (HOSPITAL_COMMUNITY): Payer: Self-pay | Admitting: Emergency Medicine

## 2017-03-23 ENCOUNTER — Inpatient Hospital Stay (HOSPITAL_COMMUNITY)
Admission: RE | Admit: 2017-03-23 | Discharge: 2017-03-27 | DRG: 330 | Disposition: A | Discharge status: Home / Self Care | Payer: BLUE CROSS/BLUE SHIELD | Source: Ambulatory Visit | Attending: Surgery | Admitting: Surgery

## 2017-03-23 ENCOUNTER — Ambulatory Visit: Payer: Self-pay | Admitting: Surgery

## 2017-03-23 ENCOUNTER — Encounter (HOSPITAL_COMMUNITY)
Admission: RE | Disposition: A | Discharge status: Home / Self Care | Payer: Self-pay | Source: Ambulatory Visit | Attending: Surgery

## 2017-03-23 ENCOUNTER — Other Ambulatory Visit: Payer: Self-pay

## 2017-03-23 DIAGNOSIS — Z7984 Long term (current) use of oral hypoglycemic drugs: Secondary | ICD-10-CM

## 2017-03-23 DIAGNOSIS — Z79899 Other long term (current) drug therapy: Secondary | ICD-10-CM | POA: Diagnosis not present

## 2017-03-23 DIAGNOSIS — Z91040 Latex allergy status: Secondary | ICD-10-CM

## 2017-03-23 DIAGNOSIS — K5720 Diverticulitis of large intestine with perforation and abscess without bleeding: Secondary | ICD-10-CM | POA: Diagnosis present

## 2017-03-23 DIAGNOSIS — Z888 Allergy status to other drugs, medicaments and biological substances status: Secondary | ICD-10-CM | POA: Diagnosis not present

## 2017-03-23 DIAGNOSIS — Z8719 Personal history of other diseases of the digestive system: Secondary | ICD-10-CM

## 2017-03-23 DIAGNOSIS — Z433 Encounter for attention to colostomy: Principal | ICD-10-CM

## 2017-03-23 DIAGNOSIS — K219 Gastro-esophageal reflux disease without esophagitis: Secondary | ICD-10-CM | POA: Diagnosis present

## 2017-03-23 DIAGNOSIS — F419 Anxiety disorder, unspecified: Secondary | ICD-10-CM | POA: Diagnosis present

## 2017-03-23 DIAGNOSIS — Z9109 Other allergy status, other than to drugs and biological substances: Secondary | ICD-10-CM | POA: Diagnosis not present

## 2017-03-23 DIAGNOSIS — Z91011 Allergy to milk products: Secondary | ICD-10-CM

## 2017-03-23 DIAGNOSIS — Z91048 Other nonmedicinal substance allergy status: Secondary | ICD-10-CM | POA: Diagnosis not present

## 2017-03-23 DIAGNOSIS — Z933 Colostomy status: Secondary | ICD-10-CM

## 2017-03-23 DIAGNOSIS — Z881 Allergy status to other antibiotic agents status: Secondary | ICD-10-CM | POA: Diagnosis not present

## 2017-03-23 DIAGNOSIS — Z9049 Acquired absence of other specified parts of digestive tract: Secondary | ICD-10-CM

## 2017-03-23 DIAGNOSIS — Z79890 Hormone replacement therapy (postmenopausal): Secondary | ICD-10-CM

## 2017-03-23 DIAGNOSIS — Z8249 Family history of ischemic heart disease and other diseases of the circulatory system: Secondary | ICD-10-CM

## 2017-03-23 DIAGNOSIS — Z880 Allergy status to penicillin: Secondary | ICD-10-CM | POA: Diagnosis not present

## 2017-03-23 HISTORY — PX: COLOSTOMY CLOSURE: SHX1381

## 2017-03-23 HISTORY — PX: PROCTOSCOPY: SHX2266

## 2017-03-23 LAB — CBC
HCT: 44.8 % (ref 36.0–46.0)
HEMOGLOBIN: 14 g/dL (ref 12.0–15.0)
MCH: 29.2 pg (ref 26.0–34.0)
MCHC: 31.3 g/dL (ref 30.0–36.0)
MCV: 93.3 fL (ref 78.0–100.0)
Platelets: 213 10*3/uL (ref 150–400)
RBC: 4.8 MIL/uL (ref 3.87–5.11)
RDW: 14.8 % (ref 11.5–15.5)
WBC: 12.9 10*3/uL — AB (ref 4.0–10.5)

## 2017-03-23 LAB — CREATININE, SERUM
Creatinine, Ser: 0.85 mg/dL (ref 0.44–1.00)
GFR calc Af Amer: >60 mL/min (ref >60)

## 2017-03-23 SURGERY — COLOSTOMY CLOSURE
Anesthesia: General | Site: Abdomen

## 2017-03-23 MED ORDER — 0.9 % SODIUM CHLORIDE (POUR BTL) OPTIME
TOPICAL | Status: DC | PRN
  Administered 2017-03-23: 3000 mL

## 2017-03-23 MED ORDER — ALVIMOPAN 12 MG PO CAPS
12.0000 mg | ORAL_CAPSULE | ORAL | Status: AC
  Administered 2017-03-23: 12 mg via ORAL
  Filled 2017-03-23: qty 1

## 2017-03-23 MED ORDER — KCL IN DEXTROSE-NACL 20-5-0.45 MEQ/L-%-% IV SOLN
INTRAVENOUS | Status: DC
  Administered 2017-03-23: 20:00:00 via INTRAVENOUS
  Administered 2017-03-24: 1000 mL via INTRAVENOUS
  Administered 2017-03-26: 18:00:00 via INTRAVENOUS
  Filled 2017-03-23 (×6): qty 1000

## 2017-03-23 MED ORDER — PHENYLEPHRINE 40 MCG/ML (10ML) SYRINGE FOR IV PUSH (FOR BLOOD PRESSURE SUPPORT)
PREFILLED_SYRINGE | INTRAVENOUS | Status: DC | PRN
  Administered 2017-03-23: 40 ug via INTRAVENOUS
  Administered 2017-03-23: 80 ug via INTRAVENOUS

## 2017-03-23 MED ORDER — OXYCODONE HCL 5 MG PO TABS
5.0000 mg | ORAL_TABLET | ORAL | Status: DC | PRN
  Administered 2017-03-25 – 2017-03-27 (×6): 10 mg via ORAL
  Filled 2017-03-23 (×6): qty 2

## 2017-03-23 MED ORDER — LIDOCAINE 2% (20 MG/ML) 5 ML SYRINGE
INTRAMUSCULAR | Status: DC | PRN
  Administered 2017-03-23: 80 mg via INTRAVENOUS

## 2017-03-23 MED ORDER — ONDANSETRON HCL 4 MG/2ML IJ SOLN
INTRAMUSCULAR | Status: AC
  Filled 2017-03-23: qty 2

## 2017-03-23 MED ORDER — DEXTROSE 5 % IV SOLN
900.0000 mg | INTRAVENOUS | Status: AC
  Administered 2017-03-23: 900 mg via INTRAVENOUS

## 2017-03-23 MED ORDER — FENTANYL CITRATE (PF) 100 MCG/2ML IJ SOLN
50.0000 ug | INTRAMUSCULAR | Status: AC | PRN
  Administered 2017-03-23 (×2): 50 ug via INTRAVENOUS

## 2017-03-23 MED ORDER — PANTOPRAZOLE SODIUM 40 MG PO TBEC
40.0000 mg | DELAYED_RELEASE_TABLET | Freq: Every day | ORAL | Status: DC
  Administered 2017-03-24 – 2017-03-27 (×4): 40 mg via ORAL
  Filled 2017-03-23 (×4): qty 1

## 2017-03-23 MED ORDER — FENTANYL CITRATE (PF) 100 MCG/2ML IJ SOLN
INTRAMUSCULAR | Status: AC
  Filled 2017-03-23: qty 2

## 2017-03-23 MED ORDER — DEXAMETHASONE SODIUM PHOSPHATE 10 MG/ML IJ SOLN
INTRAMUSCULAR | Status: AC
  Filled 2017-03-23: qty 1

## 2017-03-23 MED ORDER — PROPOFOL 10 MG/ML IV BOLUS
INTRAVENOUS | Status: AC
  Filled 2017-03-23: qty 20

## 2017-03-23 MED ORDER — KETOROLAC TROMETHAMINE 30 MG/ML IJ SOLN
30.0000 mg | Freq: Once | INTRAMUSCULAR | Status: AC
  Administered 2017-03-23: 30 mg via INTRAVENOUS

## 2017-03-23 MED ORDER — ALBUTEROL SULFATE (2.5 MG/3ML) 0.083% IN NEBU: 3.0000 mL | INHALATION_SOLUTION | Freq: Four times a day (QID) | RESPIRATORY_TRACT | Status: DC | PRN

## 2017-03-23 MED ORDER — GENTAMICIN SULFATE 40 MG/ML IJ SOLN
340.0000 mg | Freq: Once | INTRAVENOUS | Status: DC
  Filled 2017-03-23: qty 8.5

## 2017-03-23 MED ORDER — FENTANYL CITRATE (PF) 100 MCG/2ML IJ SOLN
25.0000 ug | INTRAMUSCULAR | Status: DC | PRN
  Administered 2017-03-23 (×3): 50 ug via INTRAVENOUS

## 2017-03-23 MED ORDER — ACETAMINOPHEN 325 MG PO TABS
650.0000 mg | ORAL_TABLET | Freq: Four times a day (QID) | ORAL | Status: DC | PRN
Start: 2017-03-23 — End: 2017-03-27

## 2017-03-23 MED ORDER — ACETAMINOPHEN 10 MG/ML IV SOLN
INTRAVENOUS | Status: DC | PRN
  Administered 2017-03-23: 1000 mg via INTRAVENOUS

## 2017-03-23 MED ORDER — FENTANYL CITRATE (PF) 100 MCG/2ML IJ SOLN
INTRAMUSCULAR | Status: DC | PRN
  Administered 2017-03-23: 25 ug via INTRAVENOUS
  Administered 2017-03-23 (×3): 50 ug via INTRAVENOUS

## 2017-03-23 MED ORDER — ONDANSETRON HCL 4 MG/2ML IJ SOLN
INTRAMUSCULAR | Status: DC | PRN
  Administered 2017-03-23: 4 mg via INTRAVENOUS

## 2017-03-23 MED ORDER — TRAMADOL HCL 50 MG PO TABS: 50.0000 mg | ORAL_TABLET | Freq: Four times a day (QID) | ORAL | Status: DC | PRN

## 2017-03-23 MED ORDER — ACETAMINOPHEN 650 MG RE SUPP: 650.0000 mg | Freq: Four times a day (QID) | RECTAL | Status: DC | PRN

## 2017-03-23 MED ORDER — ONDANSETRON 4 MG PO TBDP: 4.0000 mg | ORAL_TABLET | Freq: Four times a day (QID) | ORAL | Status: DC | PRN

## 2017-03-23 MED ORDER — MOMETASONE FURO-FORMOTEROL FUM 200-5 MCG/ACT IN AERO
2.0000 | INHALATION_SPRAY | Freq: Every day | RESPIRATORY_TRACT | Status: DC
  Administered 2017-03-24 – 2017-03-27 (×4): 2 via RESPIRATORY_TRACT
  Filled 2017-03-23: qty 8.8

## 2017-03-23 MED ORDER — GENTAMICIN SULFATE 40 MG/ML IJ SOLN
5.0000 mg/kg | INTRAVENOUS | Status: AC
  Administered 2017-03-23: 340 mg via INTRAVENOUS

## 2017-03-23 MED ORDER — EPHEDRINE 5 MG/ML INJ
INTRAVENOUS | Status: AC
  Filled 2017-03-23: qty 10

## 2017-03-23 MED ORDER — CHLORHEXIDINE GLUCONATE CLOTH 2 % EX PADS: 6.0000 | MEDICATED_PAD | Freq: Once | CUTANEOUS | Status: DC

## 2017-03-23 MED ORDER — ROCURONIUM BROMIDE 10 MG/ML (PF) SYRINGE
PREFILLED_SYRINGE | INTRAVENOUS | Status: AC
  Filled 2017-03-23: qty 5

## 2017-03-23 MED ORDER — ACETAMINOPHEN 10 MG/ML IV SOLN
INTRAVENOUS | Status: AC
  Filled 2017-03-23: qty 100

## 2017-03-23 MED ORDER — PROPOFOL 10 MG/ML IV BOLUS
INTRAVENOUS | Status: DC | PRN
  Administered 2017-03-23: 140 mg via INTRAVENOUS

## 2017-03-23 MED ORDER — ENOXAPARIN SODIUM 40 MG/0.4ML ~~LOC~~ SOLN
40.0000 mg | SUBCUTANEOUS | Status: DC
  Administered 2017-03-24 – 2017-03-27 (×4): 40 mg via SUBCUTANEOUS
  Filled 2017-03-23 (×4): qty 0.4

## 2017-03-23 MED ORDER — ONDANSETRON HCL 4 MG/2ML IJ SOLN: 4.0000 mg | Freq: Four times a day (QID) | INTRAMUSCULAR | Status: DC | PRN

## 2017-03-23 MED ORDER — HYDROMORPHONE HCL 1 MG/ML IJ SOLN
1.0000 mg | INTRAMUSCULAR | Status: DC | PRN
  Administered 2017-03-23 – 2017-03-25 (×5): 1 mg via INTRAVENOUS
  Filled 2017-03-23 (×6): qty 1

## 2017-03-23 MED ORDER — MONTELUKAST SODIUM 10 MG PO TABS
10.0000 mg | ORAL_TABLET | Freq: Every day | ORAL | Status: DC
  Administered 2017-03-23 – 2017-03-26 (×4): 10 mg via ORAL
  Filled 2017-03-23 (×4): qty 1

## 2017-03-23 MED ORDER — GABAPENTIN 300 MG PO CAPS
900.0000 mg | ORAL_CAPSULE | Freq: Every day | ORAL | Status: DC
  Administered 2017-03-23 – 2017-03-26 (×4): 900 mg via ORAL
  Filled 2017-03-23 (×4): qty 3

## 2017-03-23 MED ORDER — DULOXETINE HCL 30 MG PO CPEP
30.0000 mg | ORAL_CAPSULE | Freq: Every day | ORAL | Status: DC
  Administered 2017-03-24 – 2017-03-27 (×4): 30 mg via ORAL
  Filled 2017-03-23 (×4): qty 1

## 2017-03-23 MED ORDER — LEVOTHYROXINE SODIUM 50 MCG PO TABS
50.0000 ug | ORAL_TABLET | Freq: Every day | ORAL | Status: DC
  Administered 2017-03-24 – 2017-03-27 (×4): 50 ug via ORAL
  Filled 2017-03-23 (×4): qty 1

## 2017-03-23 MED ORDER — LACTATED RINGERS IV SOLN
INTRAVENOUS | Status: DC
  Administered 2017-03-23: 11:00:00 via INTRAVENOUS

## 2017-03-23 MED ORDER — KETOROLAC TROMETHAMINE 30 MG/ML IJ SOLN
INTRAMUSCULAR | Status: AC
  Administered 2017-03-23: 30 mg via INTRAVENOUS
  Filled 2017-03-23: qty 1

## 2017-03-23 MED ORDER — DEXAMETHASONE SODIUM PHOSPHATE 10 MG/ML IJ SOLN
INTRAMUSCULAR | Status: DC | PRN
  Administered 2017-03-23: 10 mg via INTRAVENOUS

## 2017-03-23 MED ORDER — EPHEDRINE SULFATE-NACL 50-0.9 MG/10ML-% IV SOSY
PREFILLED_SYRINGE | INTRAVENOUS | Status: DC | PRN
  Administered 2017-03-23: 5 mg via INTRAVENOUS

## 2017-03-23 MED ORDER — CLINDAMYCIN PHOSPHATE 900 MG/50ML IV SOLN
INTRAVENOUS | Status: AC
  Filled 2017-03-23: qty 50

## 2017-03-23 MED ORDER — SUGAMMADEX SODIUM 200 MG/2ML IV SOLN
INTRAVENOUS | Status: DC | PRN
  Administered 2017-03-23: 140 mg via INTRAVENOUS

## 2017-03-23 MED ORDER — FENTANYL CITRATE (PF) 100 MCG/2ML IJ SOLN
INTRAMUSCULAR | Status: AC
  Administered 2017-03-23: 50 ug via INTRAVENOUS
  Filled 2017-03-23: qty 2

## 2017-03-23 MED ORDER — ROCURONIUM BROMIDE 10 MG/ML (PF) SYRINGE
PREFILLED_SYRINGE | INTRAVENOUS | Status: DC | PRN
  Administered 2017-03-23: 50 mg via INTRAVENOUS

## 2017-03-23 MED ORDER — CLINDAMYCIN PHOSPHATE 900 MG/50ML IV SOLN: 900.0000 mg | Freq: Once | INTRAVENOUS | Status: DC

## 2017-03-23 MED ORDER — MIDAZOLAM HCL 5 MG/5ML IJ SOLN
INTRAMUSCULAR | Status: DC | PRN
  Administered 2017-03-23: 2 mg via INTRAVENOUS

## 2017-03-23 MED ORDER — LIDOCAINE 2% (20 MG/ML) 5 ML SYRINGE
INTRAMUSCULAR | Status: AC
  Filled 2017-03-23: qty 5

## 2017-03-23 SURGICAL SUPPLY — 58 items
BLADE EXTENDED COATED 6.5IN (ELECTRODE) IMPLANT
BLADE HEX COATED 2.75 (ELECTRODE) ×4 IMPLANT
BLADE SURG SZ10 CARB STEEL (BLADE) IMPLANT
CHLORAPREP W/TINT 26ML (MISCELLANEOUS) IMPLANT
COUNTER NEEDLE 20 DBL MAG RED (NEEDLE) ×4 IMPLANT
COVER MAYO STAND STRL (DRAPES) ×8 IMPLANT
COVER SURGICAL LIGHT HANDLE (MISCELLANEOUS) ×8 IMPLANT
DRAPE LAPAROSCOPIC ABDOMINAL (DRAPES) ×4 IMPLANT
DRAPE SHEET LG 3/4 BI-LAMINATE (DRAPES) IMPLANT
DRAPE UTILITY XL STRL (DRAPES) ×8 IMPLANT
DRAPE WARM FLUID 44X44 (DRAPE) ×4 IMPLANT
DRSG OPSITE POSTOP 4X10 (GAUZE/BANDAGES/DRESSINGS) ×4 IMPLANT
DRSG OPSITE POSTOP 4X6 (GAUZE/BANDAGES/DRESSINGS) ×4 IMPLANT
DRSG OPSITE POSTOP 4X8 (GAUZE/BANDAGES/DRESSINGS) IMPLANT
ELECT PENCIL ROCKER SW 15FT (MISCELLANEOUS) ×4 IMPLANT
ELECT REM PT RETURN 15FT ADLT (MISCELLANEOUS) ×4 IMPLANT
EVACUATOR DRAINAGE 10X20 100CC (DRAIN) IMPLANT
EVACUATOR SILICONE 100CC (DRAIN)
GAUZE SPONGE 4X4 12PLY STRL (GAUZE/BANDAGES/DRESSINGS) IMPLANT
GLOVE BIOGEL PI IND STRL 6.5 (GLOVE) ×8 IMPLANT
GLOVE BIOGEL PI IND STRL 7.0 (GLOVE) ×8 IMPLANT
GLOVE BIOGEL PI IND STRL 7.5 (GLOVE) ×4 IMPLANT
GLOVE BIOGEL PI IND STRL 8 (GLOVE) ×6 IMPLANT
GLOVE BIOGEL PI INDICATOR 6.5 (GLOVE) ×8
GLOVE BIOGEL PI INDICATOR 7.0 (GLOVE) ×8
GLOVE BIOGEL PI INDICATOR 7.5 (GLOVE) ×4
GLOVE BIOGEL PI INDICATOR 8 (GLOVE) ×6
GLOVE SURG ORTHO 8.0 STRL STRW (GLOVE) IMPLANT
GOWN STRL REUS W/TWL XL LVL3 (GOWN DISPOSABLE) ×16 IMPLANT
HANDLE SUCTION POOLE (INSTRUMENTS) ×4 IMPLANT
HOLDER FOLEY CATH W/STRAP (MISCELLANEOUS) ×4 IMPLANT
KIT BASIN OR (CUSTOM PROCEDURE TRAY) ×4 IMPLANT
LEGGING LITHOTOMY PAIR STRL (DRAPES) ×4 IMPLANT
LUBRICANT JELLY K Y 4OZ (MISCELLANEOUS) IMPLANT
PACK GENERAL/GYN (CUSTOM PROCEDURE TRAY) ×4 IMPLANT
SPONGE LAP 18X18 5 PK (GAUZE/BANDAGES/DRESSINGS) ×4 IMPLANT
STAPLER CIRC CVD 29MM 37CM (STAPLE) ×4 IMPLANT
STAPLER VISISTAT 35W (STAPLE) ×4 IMPLANT
SUCTION POOLE HANDLE (INSTRUMENTS) ×8
SUT ETHILON 3 0 PS 1 (SUTURE) IMPLANT
SUT NOV 1 T60/GS (SUTURE) IMPLANT
SUT NOVA NAB DX-16 0-1 5-0 T12 (SUTURE) ×24 IMPLANT
SUT NOVA T20/GS 25 (SUTURE) IMPLANT
SUT PROLENE 0 SH 30 (SUTURE) ×4 IMPLANT
SUT SILK 2 0 (SUTURE) ×2
SUT SILK 2 0 SH CR/8 (SUTURE) ×4 IMPLANT
SUT SILK 2-0 18XBRD TIE 12 (SUTURE) ×2 IMPLANT
SUT SILK 3 0 (SUTURE) ×2
SUT SILK 3 0 SH CR/8 (SUTURE) ×4 IMPLANT
SUT SILK 3-0 18XBRD TIE 12 (SUTURE) ×2 IMPLANT
SUT VIC AB 4-0 SH 18 (SUTURE) IMPLANT
TOWEL OR 17X26 10 PK STRL BLUE (TOWEL DISPOSABLE) ×12 IMPLANT
TOWEL OR NON WOVEN STRL DISP B (DISPOSABLE) ×4 IMPLANT
TRAY FOLEY BAG SILVER LF 16FR (CATHETERS) ×4 IMPLANT
TRAY FOLEY W/METER SILVER 16FR (SET/KITS/TRAYS/PACK) IMPLANT
TUBING CONNECTING 10 (TUBING) IMPLANT
TUBING CONNECTING 10' (TUBING)
YANKAUER SUCT BULB TIP 10FT TU (MISCELLANEOUS) ×4 IMPLANT

## 2017-03-23 NOTE — Anesthesia Postprocedure Evaluation (Signed)
Anesthesia Post Note  Patient: Noel JourneyRosemary Bernstein  Procedure(s) Performed: COLOSTOMY CLOSURE ERAS PATHWAY (N/A Abdomen) PROCTOSCOPY     Patient location during evaluation: PACU Anesthesia Type: General Level of consciousness: awake and alert Pain management: pain level controlled Vital Signs Assessment: post-procedure vital signs reviewed and stable Respiratory status: spontaneous breathing, nonlabored ventilation, respiratory function stable and patient connected to nasal cannula oxygen Cardiovascular status: blood pressure returned to baseline and stable Postop Assessment: no apparent nausea or vomiting Anesthetic complications: no    Last Vitals:  Vitals:   03/23/17 1845 03/23/17 1958  BP: 107/62 (!) 93/51  Pulse: 90 78  Resp: 17 17  Temp: 37 C 36.8 C  SpO2: 95% 96%    Last Pain:  Vitals:   03/23/17 1958  TempSrc: Oral  PainSc:                  Beryle Lathehomas E Brock

## 2017-03-23 NOTE — Brief Op Note (Signed)
03/23/2017  2:18 PM  PATIENT:  Noel Journeyosemary Mihalko  62 y.o. female  PRE-OPERATIVE DIAGNOSIS:  perforated diverticular disease, presence of colostomy  POST-OPERATIVE DIAGNOSIS:  perforated diverticular disease, presence of colostomy  PROCEDURE:  Procedure(s): COLOSTOMY CLOSURE ERAS PATHWAY (N/A) PROCTOSCOPY  SURGEON:  Surgeon(s) and Role:    * Darnell LevelGerkin, Sameka Bagent, MD - Primary    * Manus Ruddsuei, Matthew, MD - Assisting  ANESTHESIA:   general  EBL:  75 mL   BLOOD ADMINISTERED:none  DRAINS: none   LOCAL MEDICATIONS USED:  NONE  SPECIMEN:  Excision  DISPOSITION OF SPECIMEN:  PATHOLOGY  COUNTS:  YES  TOURNIQUET:  * No tourniquets in log *  DICTATION: .Other Dictation: Dictation Number [id number]  PLAN OF CARE: Admit to inpatient   PATIENT DISPOSITION:  PACU - hemodynamically stable.   Delay start of Pharmacological VTE agent (>24hrs) due to surgical blood loss or risk of bleeding: yes  Darnell Levelodd Nemesio Castrillon, MD [facility] Office: [phone]

## 2017-03-23 NOTE — Interval H&P Note (Signed)
History and Physical Interval Note:  03/23/2017 10:52 AM  Erica Caldwell  has presented today for surgery, with the diagnosis of perforated diverticular disease, presence of colostomy  The various methods of treatment have been discussed with the patient and family. After consideration of risks, benefits and other options for treatment, the patient has consented to    Procedure(s): COLOSTOMY CLOSURE ERAS PATHWAY (N/A) as a surgical intervention .    The patient's history has been reviewed, patient examined, no change in status, stable for surgery.  I have reviewed the patient's chart and labs.  Questions were answered to the patient's satisfaction.    Darnell Levelodd [name], MD [facility] Office: [phone]    [name] MJudie Petit

## 2017-03-23 NOTE — Anesthesia Procedure Notes (Signed)
Procedure Name: Intubation Date/Time: 03/23/2017 12:11 PM Performed by: Victoriano Lain, CRNA Pre-anesthesia Checklist: Patient identified, Emergency Drugs available, Suction available, Patient being monitored and Timeout performed Patient Re-evaluated:Patient Re-evaluated prior to induction Oxygen Delivery Method: Circle system utilized Preoxygenation: Pre-oxygenation with 100% oxygen Induction Type: IV induction Ventilation: Mask ventilation without difficulty Laryngoscope Size: Mac and 3 Grade View: Grade I Tube type: Oral Tube size: 7.5 mm Airway Equipment and Method: Stylet Placement Confirmation: ETT inserted through vocal cords under direct vision,  positive ETCO2 and breath sounds checked- equal and bilateral Secured at: 21 cm Tube secured with: Tape Dental Injury: Teeth and Oropharynx as per pre-operative assessment

## 2017-03-23 NOTE — Anesthesia Preprocedure Evaluation (Addendum)
Anesthesia Evaluation  Patient identified by MRN, date of birth, ID band Patient awake    Reviewed: Allergy & Precautions, NPO status , Patient's Chart, lab work & pertinent test results  Airway Mallampati: II  TM Distance: >3 FB Neck ROM: Full    Dental no notable dental hx. (+) Poor Dentition, Dental Advidsory Given   Pulmonary neg pulmonary ROS, asthma ,    Pulmonary exam normal breath sounds clear to auscultation       Cardiovascular negative cardio ROS Normal cardiovascular exam Rhythm:Regular Rate:Normal     Neuro/Psych  Headaches, negative neurological ROS  negative psych ROS   GI/Hepatic negative GI ROS, Neg liver ROS,   Endo/Other  negative endocrine ROS  Renal/GU negative Renal ROS  negative genitourinary   Musculoskeletal negative musculoskeletal ROS (+) Arthritis , Osteoarthritis,  Fibromyalgia -  Abdominal (+) + obese,   Peds negative pediatric ROS (+)  Hematology negative hematology ROS (+)   Anesthesia Other Findings Hx of perforated bowel; now here for closure 150 Propofol  Reproductive/Obstetrics negative OB ROS                            Anesthesia Physical  Anesthesia Plan  ASA: III  Anesthesia Plan: General   Post-op Pain Management:    Induction: Intravenous  PONV Risk Score and Plan: 3 and Ondansetron, Dexamethasone and Midazolam  Airway Management Planned: Oral ETT  Additional Equipment: None  Intra-op Plan:   Post-operative Plan: Extubation in OR and Possible Post-op intubation/ventilation  Informed Consent: I have reviewed the patients History and Physical, chart, labs and discussed the procedure including the risks, benefits and alternatives for the proposed anesthesia with the patient or authorized representative who has indicated his/her understanding and acceptance.   Dental advisory given  Plan Discussed with: CRNA  Anesthesia Plan  Comments:         Anesthesia Quick Evaluation

## 2017-03-23 NOTE — Transfer of Care (Signed)
Immediate Anesthesia Transfer of Care Note  Patient: Noel JourneyRosemary Stoutenburg  Procedure(s) Performed: COLOSTOMY CLOSURE ERAS PATHWAY (N/A Abdomen) PROCTOSCOPY  Patient Location: PACU  Anesthesia Type:General  Level of Consciousness: awake, alert , oriented and patient cooperative  Airway & Oxygen Therapy: Patient Spontanous Breathing and Patient connected to face mask oxygen  Post-op Assessment: Report given to RN, Post -op Vital signs reviewed and stable and Patient moving all extremities  Post vital signs: Reviewed and stable  Last Vitals:  Vitals:   03/23/17 1002  BP: 116/65  Pulse: 73  Resp: 16  Temp: 36.9 C  SpO2: 100%    Last Pain:  Vitals:   03/23/17 1052  TempSrc:   PainSc: 1       Patients Stated Pain Goal: 4 (03/23/17 1052)  Complications: No apparent anesthesia complications

## 2017-03-24 ENCOUNTER — Encounter (HOSPITAL_COMMUNITY): Payer: Self-pay | Admitting: Surgery

## 2017-03-24 MED ORDER — ALUM & MAG HYDROXIDE-SIMETH 200-200-20 MG/5ML PO SUSP
30.0000 mL | ORAL | Status: DC | PRN
  Administered 2017-03-24: 30 mL via ORAL
  Filled 2017-03-24: qty 30

## 2017-03-24 MED ORDER — SIMETHICONE 80 MG PO CHEW
80.0000 mg | CHEWABLE_TABLET | Freq: Four times a day (QID) | ORAL | Status: DC | PRN
  Administered 2017-03-24: 80 mg via ORAL
  Filled 2017-03-24: qty 1

## 2017-03-24 NOTE — Progress Notes (Signed)
  General Surgery [facility]  Assessment & Plan: POD#1 - status post open colostomy closure  Begin clear liquids this AM  Has been OOB, encourage ambulation  Maalox and PPI for reflux symptoms        Velora Hecklerodd M. Ulrick Methot, MD, [facility]       [facility]       Office: [phone]    Chief Complaint: Colostomy closure  Subjective: Patient in bed, some reflux symptoms.  Ambulated in halls.  Starting clear liquid diet this AM.  Objective: Vital signs in last 24 hours: Temp:  [97.6 F (36.4 C)-99 F (37.2 C)] 99 F (37.2 C) (02/22 0401) Pulse Rate:  [73-94] 91 (02/22 0401) Resp:  [12-19] 17 (02/22 0401) BP: (93-122)/(45-70) 109/64 (02/22 0401) SpO2:  [95 %-100 %] 99 % (02/22 0401) Weight:  [68 kg (150 lb)] 68 kg (150 lb) (02/21 1052) Last BM Date: 03/23/17  Intake/Output from previous day: 02/21 0701 - 02/22 0700 In: 2613.8 [P.O.:150; I.V.:2363.8; IV Piggyback:100] Out: 625 [Urine:550; Blood:75] Intake/Output this shift: No intake/output data recorded.  Physical Exam: HEENT - sclerae clear, mucous membranes moist Neck - soft Chest - clear bilaterally Cor - RRR Abdomen - soft without distension; wounds dry and intact Ext - no edema, non-tender Neuro - alert & oriented, no focal deficits  Lab Results:  Recent Labs    03/23/17 1434  WBC 12.9*  HGB 14.0  HCT 44.8  PLT 213   BMET Recent Labs    03/23/17 1434  CREATININE 0.85   PT/INR No results for input(s): LABPROT, INR in the last 72 hours. Comprehensive Metabolic Panel:    Component Value Date/Time   NA 145 03/13/2017 1453   NA 139 11/07/2016 0517   K 4.0 03/13/2017 1453   K 3.8 11/07/2016 0517   CL 117 (H) 03/13/2017 1453   CL 112 (H) 11/07/2016 0517   CO2 22 03/13/2017 1453   CO2 19 (L) 11/07/2016 0517   BUN 9 03/13/2017 1453   BUN 7 11/07/2016 0517   CREATININE 0.85 03/23/2017 1434   CREATININE 0.86 03/13/2017 1453   GLUCOSE 100 (H) 03/13/2017 1453   GLUCOSE  97 11/07/2016 0517   CALCIUM 9.6 03/13/2017 1453   CALCIUM 8.6 (L) 11/07/2016 0517   AST 21 11/04/2016 0440   AST 23 11/02/2016 0459   ALT 20 11/04/2016 0440   ALT 24 11/02/2016 0459   ALKPHOS 29 (L) 11/04/2016 0440   ALKPHOS 35 (L) 11/02/2016 0459   BILITOT 0.4 11/04/2016 0440   BILITOT 0.7 11/02/2016 0459   PROT 4.9 (L) 11/04/2016 0440   PROT 5.6 (L) 11/02/2016 0459   ALBUMIN 3.1 (L) 11/04/2016 0440   ALBUMIN 3.7 11/02/2016 0459    Studies/Results: No results found.    Jene Huq M 03/24/2017  Patient ID: Erica Caldwell, female   DOB: [birth year], 62 y.o.   MRN: [id number]

## 2017-03-24 NOTE — Op Note (Signed)
NAME:  Erica Caldwell, Erica Caldwell                 ACCOUNT NO.:  MEDICAL RECORD NO.:  [id number]  LOCATION:                                 FACILITY:  PHYSICIAN:  Velora Hecklerodd M Marshea Wisher, MD      DATE OF BIRTH:  [birth year]  DATE OF PROCEDURE:  03/23/2017                              OPERATIVE REPORT   PREOPERATIVE DIAGNOSIS:  Presence of colostomy, history of diverticulitis with perforation.  POSTOPERATIVE DIAGNOSIS:  Presence of colostomy, history of diverticulitis with perforation.  PROCEDURE:  Open closure of colostomy with resection.  SURGEON:  Velora Hecklerodd M Kyandre Okray, MD  ASSISTANT:  Wilmon ArmsMatthew K. Tsuei, M.D.  ANESTHESIA:  General.  ESTIMATED BLOOD LOSS:  Less than 50 mL.  PREPARATION:  Betadine.  COMPLICATIONS:  None.  INDICATIONS:  The patient is a 62 year old white female, who underwent exploratory laparotomy and Hartmann's resection for perforated sigmoid diverticulitis in October 2018.  The patient now returns to the operating room for colostomy closure.  BODY OF REPORT:  [facility]  The patient was brought to the operating room, placed in supine position on the operating room table.  Following administration of general anesthesia, the patient was placed in lithotomy and then prepped and draped in usual aseptic fashion.  After ascertaining that an adequate level of anesthesia had been achieved, the patient's previous midline abdominal scar was excised with a #10 blade. Dissection was carried in the subcutaneous tissues.  Dissection was carried down to the fascia.  Previous Novafil sutures in the midline fascia were removed.  Fascia was incised and the peritoneal cavity was entered cautiously.  Omental adhesions to the anterior abdominal wall were taken down with the electrocautery.  The abdomen was relatively free of adhesions.  The pelvis was relatively free of adhesions. Prolene sutures marked the proximal rectum where it was  transected.  Using Kocher clamps, the left abdominal wall was elevated.  Adhesions of the proximal sigmoid colon were taken down allowing for mobilization of the colostomy site.  The skin was incised at the colostomy so as to remove the entire colostomy in an elliptical incision.  Dissection was carried into the subcutaneous tissues.  The colostomy was inverted back within the abdomen and the remaining adhesions to the abdominal wall were taken down with the electrocautery.  The colostomy itself was resected.  A point proximal to the colostomy was selected where the bowel appeared healthy.  Mesentery was divided between [facility] clamps and ligated with 2-0 silk ties.  An Allen clamp was applied proximally and a Kocher clamp was applied distally and the bowel was divided sharply.  Stoma was submitted to Pathology for review.  Next, Balfour retractor was placed and the small bowel was packed cephalad.  This allowed for good visualization of the pelvic structures. There were minimal adhesions.  Prolene sutures marked the proximal rectum.  The anterior rectal wall appeared healthy and free of adhesions.  The anus was then dilated.  Using progressive dilators, the dilators were passed through the anus up the rectum to the staple line.  A 29-mm EEA stapler was selected.  A 0 Prolene pursestring suture was  placed in the end of the proximal sigmoid colon.  The anvil from the stapler was inserted into the bowel and the pursestring suture was tied securely.  Next, the EEA stapler was placed through the anus and advanced through the rectum up to the staple line.  Stapler was withdrawn slightly and then angled anteriorly so that the anastomosis will be performed on the anterior surface of the proximal rectum. Trocar was advanced and the anvil was assembled onto the trocar and snapped into position.  The EEA stapler was then closed under direct vision.  Tissue was allowed to decompress.  Stapler  was then fired and withdrawn in the usual fashion.  Two complete rings of tissue were noted within the stapler.  Next, the sigmoid colon was occluded with a spring clamp.  A rigid sigmoidoscope was placed into the anus and the rectum was insufflated with air.  The distal sigmoid colon distended nicely including the area of the anastomosis.  This was placed under some tension.  This was performed under saline and there was no evidence of bubbles or leakage.  Direct visualization of the anastomosis was performed and there was good hemostasis noted.  Air was evacuated and the rigid sigmoidoscope was withdrawn from the rectum.  Spring clamp was removed.  Abdomen was irrigated with warm saline, which was evacuated.  Good hemostasis was noted.  The ostomy site was closed in a single layer with interrupted #1 Novafil simple sutures.  Midline abdominal incision was closed with interrupted #1 Novafil simple sutures.  Subcutaneous tissues of both wounds were irrigated and good hemostasis achieved.  Skin of both incisions was closed with stainless steel staples.  Honeycomb dressings were placed.  The patient was awakened from anesthesia and brought to the recovery room.  The patient tolerated the procedure well.    Darnell Level, MD [facility] Office: [phone]    TMG/MEDQ  D:  03/23/2017  T:  03/23/2017  Job:  [id number]  cc:   Velora Heckler, MD [address] [place] [address]  Willis Modena, MD Fax: [phone]

## 2017-03-25 MED ORDER — LACTASE 3000 UNITS PO TABS
6000.0000 [IU] | ORAL_TABLET | Freq: Three times a day (TID) | ORAL | Status: DC
  Administered 2017-03-25 – 2017-03-27 (×3): 6000 [IU] via ORAL
  Filled 2017-03-25 (×6): qty 2

## 2017-03-25 NOTE — Progress Notes (Signed)
2 Days Post-Op   Subjective/Chief Complaint: Moving bowels some pain    Objective: Vital signs in last 24 hours: Temp:  [98.4 F (36.9 C)-98.7 F (37.1 C)] 98.4 F (36.9 C) (02/23 0419) Pulse Rate:  [76-81] 81 (02/23 0419) Resp:  [17-18] 18 (02/23 0419) BP: (105-118)/(55-59) 118/56 (02/23 0419) SpO2:  [94 %-98 %] 98 % (02/23 0846) Last BM Date: 03/24/17  Intake/Output from previous day: 02/22 0701 - 02/23 0700 In: 2370 [P.O.:870; I.V.:1500] Out: 900 [Urine:900] Intake/Output this shift: Total I/O In: -  Out: 600 [Urine:600]  Incision/Wound:incisions CDI dressings clean and in place   Lab Results:  Recent Labs    03/23/17 1434  WBC 12.9*  HGB 14.0  HCT 44.8  PLT 213   BMET Recent Labs    03/23/17 1434  CREATININE 0.85   PT/INR No results for input(s): LABPROT, INR in the last 72 hours. ABG No results for input(s): PHART, HCO3 in the last 72 hours.  Invalid input(s): PCO2, PO2  Studies/Results: No results found.  Anti-infectives: Anti-infectives (From admission, onward)   Start     Dose/Rate Route Frequency Ordered Stop   03/23/17 1200  clindamycin (CLEOCIN) IVPB 900 mg  Status:  Discontinued     900 mg 100 mL/hr over 30 Minutes Intravenous  Once 03/23/17 1145 03/23/17 1539   03/23/17 1145  gentamicin (GARAMYCIN) 340 mg in dextrose 5 % 100 mL IVPB  Status:  Discontinued     340 mg 108.5 mL/hr over 60 Minutes Intravenous  Once 03/23/17 1145 03/23/17 1539   03/23/17 1140  clindamycin (CLEOCIN) 900 MG/50ML IVPB    Comments:  Jarvis NewcomerArmistead, Lacey   : cabinet override      03/23/17 1140 03/23/17 2344      Assessment/Plan: s/p Procedure(s): COLOSTOMY CLOSURE ERAS PATHWAY (N/A) PROCTOSCOPY Advance diet  Decrease IVF   LOS: 2 days    Maisie Fushomas A Zach Tietje 03/25/2017

## 2017-03-26 NOTE — Plan of Care (Signed)
  Elimination: Will not experience complications related to bowel motility 03/26/2017 2045 - Progressing by Antionette CharPeng, Jash Wahlen P, RN   Elimination: Will not experience complications related to urinary retention 03/26/2017 2045 - Progressing by Antionette CharPeng, Agustus Mane P, RN   Pain Managment: General experience of comfort will improve 03/26/2017 2045 - Progressing by Antionette CharPeng, Merik Mignano P, RN

## 2017-03-26 NOTE — Progress Notes (Signed)
3 Days Post-Op   Subjective/Chief Complaint: Bowel moving  Pain better but not eating a lot yet    Objective: Vital signs in last 24 hours: Temp:  [97.7 F (36.5 C)-98.3 F (36.8 C)] 97.8 F (36.6 C) (02/24 0515) Pulse Rate:  [71-74] 74 (02/24 0515) Resp:  [17-18] 17 (02/24 0515) BP: (107-138)/(59-68) 120/64 (02/24 0515) SpO2:  [96 %-100 %] 97 % (02/24 0830) Last BM Date: 03/24/17  Intake/Output from previous day: 02/23 0701 - 02/24 0700 In: 1893.8 [P.O.:580; I.V.:1313.8] Out: 600 [Urine:600] Intake/Output this shift: No intake/output data recorded.  Incision/Wound:dressings dry  Incision CDI  SOFT ND sore   Lab Results:  Recent Labs    03/23/17 1434  WBC 12.9*  HGB 14.0  HCT 44.8  PLT 213   BMET Recent Labs    03/23/17 1434  CREATININE 0.85   PT/INR No results for input(s): LABPROT, INR in the last 72 hours. ABG No results for input(s): PHART, HCO3 in the last 72 hours.  Invalid input(s): PCO2, PO2  Studies/Results: No results found.  Anti-infectives: Anti-infectives (From admission, onward)   Start     Dose/Rate Route Frequency Ordered Stop   03/23/17 1200  clindamycin (CLEOCIN) IVPB 900 mg  Status:  Discontinued     900 mg 100 mL/hr over 30 Minutes Intravenous  Once 03/23/17 1145 03/23/17 1539   03/23/17 1145  gentamicin (GARAMYCIN) 340 mg in dextrose 5 % 100 mL IVPB  Status:  Discontinued     340 mg 108.5 mL/hr over 60 Minutes Intravenous  Once 03/23/17 1145 03/23/17 1539   03/23/17 1140  clindamycin (CLEOCIN) 900 MG/50ML IVPB    Comments:  Erica NewcomerArmistead, Erica Caldwell   : cabinet override      03/23/17 1140 03/23/17 2344      Assessment/Plan: s/p Procedure(s): COLOSTOMY CLOSURE ERAS PATHWAY (N/A) PROCTOSCOPY Ambulate more  Home Monday   LOS: 3 days    Erica Caldwell 03/26/2017

## 2017-03-27 MED ORDER — OXYCODONE HCL 5 MG PO TABS: 5.0000 mg | ORAL_TABLET | Freq: Four times a day (QID) | ORAL | 0 refills | Status: DC | PRN

## 2017-03-27 NOTE — Discharge Summary (Signed)
[facility] Discharge Summary   Patient ID: Noel JourneyRosemary Sallas MRN: [id number] DOB/AGE: [birth year] 63 y.o.  Admit date: 03/23/2017 Discharge date: 03/27/2017  Admitting Diagnosis: Colostomy in place  Discharge Diagnosis Patient Active Problem List   Diagnosis Date Noted  . History of diverticulosis 03/23/2017  . History of colonic diverticulitis 03/19/2017  . Presence of colostomy (HCC) 03/19/2017  . Prednisone adverse reaction 01/02/2017  . Intractable chronic migraine without aura and with status migrainosus 01/02/2017  . Inadequate sleep hygiene 01/02/2017  . Adjustment insomnia 01/02/2017  . Snoring 11/28/2016  . Somnolence, daytime 11/28/2016  . Perforated sigmoid colon (HCC) 11/02/2016  . Perforation of sigmoid colon due to diverticulitis 11/02/2016  . Chronic migraine 08/29/2016  . Obesity 09/24/2014  . Mild persistent asthma 08/12/2014    Consultants None  Imaging: No results found.  Procedures Dr. Gerrit FriendsGerkin (03/23/17) - Open closure of colostomy with resection   Hospital Course:  Patient is a 62 year old female who [facility] reversal. Patient was admitted and underwent procedure listed above.  Tolerated procedure well and was transferred to the floor.  Diet was advanced as tolerated.  On POD#4, the patient was voiding well, tolerating diet, ambulating well, pain well controlled, vital signs stable, incisions c/d/i and felt stable for discharge home.  Patient will follow up in our office in 1-2 weeks and knows to call with questions or concerns. She will call to confirm appointment date/time.    Physical Exam: General:  Alert, NAD, pleasant, comfortable Abd:  Soft, ND, mild tenderness, incisions C/D/I  Allergies as of 03/27/2017      Reactions   Levaquin  [levofloxacin In D5w] Palpitations   Other Itching, Rash   Perfume- shortness of breath; watery eyes; headache Smoke    Latex Hives   Penicillins Other (See Comments)   Unsure  of reaction Has patient had a PCN reaction causing immediate rash, facial/tongue/throat swelling, SOB or lightheadedness with hypotension: Unknown Has patient had a PCN reaction causing severe rash involving mucus membranes or skin necrosis: Unknown Has patient had a PCN reaction that required hospitalization: Unknown Has patient had a PCN reaction occurring within the last 10 years: Yes If all of the above answers are "NO", then may proceed with Cephalosporin use.   Tegaderm Ag Mesh [silver] Hives   Patient complains of blisters/itching all over body.    Blueberry Flavor Swelling   Tongue swells and throat itchy    Cefazolin Other (See Comments)   Unknown   Lactose Intolerance (gi) Nausea And Vomiting   Chloraprep One Step [chlorhexidine Gluconate] Rash   PURPLE RASH, ITCHING   Tape Rash   Including tegaderm      Medication List    TAKE these medications   acetaminophen 500 MG tablet Commonly known as:  TYLENOL Take 500 mg by mouth every 6 (six) hours as needed for mild pain.   docusate sodium 100 MG capsule Commonly known as:  COLACE Take 100 mg by mouth daily as needed for mild constipation.   DULERA 200-5 MCG/ACT Aero Generic drug:  mometasone-formoterol Inhale 2 puffs into the lungs daily.   DULoxetine 30 MG capsule Commonly known as:  CYMBALTA Take 30 mg by mouth daily.   esomeprazole 40 MG capsule Commonly known as:  NEXIUM Take 40 mg by mouth daily at 12 noon.   gabapentin 300 MG capsule Commonly known as:  NEURONTIN Take 900 mg by mouth at bedtime.   levothyroxine 50 MCG tablet Commonly known as:  SYNTHROID, LEVOTHROID Take 50 mcg by mouth daily before breakfast.   lovastatin 40 MG tablet Commonly known as:  MEVACOR Take 40 mg by mouth every evening.   methocarbamol 500 MG tablet Commonly known as:  ROBAXIN Take 1 tablet (500 mg total) by mouth every 6 (six) hours as needed for muscle spasms.   montelukast 10 MG tablet Commonly known as:   SINGULAIR Take 10 mg by mouth at bedtime.   oxyCODONE 5 MG immediate release tablet Commonly known as:  Oxy IR/ROXICODONE Take 1-2 tablets (5-10 mg total) by mouth every 6 (six) hours as needed for moderate pain. What changed:    how much to take  when to take this  reasons to take this   PROAIR HFA 108 (90 Base) MCG/ACT inhaler Generic drug:  albuterol Inhale 2 puffs into the lungs every 6 (six) hours as needed for wheezing or shortness of breath.   SUMAtriptan 50 MG tablet Commonly known as:  IMITREX Take 1 tablet (50 mg total) by mouth every 2 (two) hours as needed for migraine. May repeat in 2 hours if headache persists or recurs.   topiramate 100 MG tablet Commonly known as:  TOPAMAX Take 1 tablet (100 mg total) by mouth 2 (two) times daily. What changed:  how much to take   triamcinolone 0.1 % cream : eucerin Crea Apply 1 application topically as needed for itching.        Follow-up Information    Darnell Level, MD. Go on 04/06/2017.   Specialty:  General Surgery Why:  Your appointment is at 3:00 PM. Please arrive 30 min prior to appointment time. Bring photo ID and insurance information with you.  Contact information: [address] [place] [address] [phone]           Signed: Wells Guiles, [facility] 03/27/2017, 10:03 AM Pager: [phone] Mon-Fri 7:00 am-4:30 pm Sat-Sun 7:00 am-11:30 am

## 2017-03-27 NOTE — Discharge Instructions (Signed)
[facility]      [facility] °[phone] ° °OPEN ABDOMINAL SURGERY: POST OP INSTRUCTIONS ° °Always review your discharge instruction sheet given to you by the facility where your surgery was performed. ° °IF YOU HAVE DISABILITY OR FAMILY LEAVE FORMS, YOU MUST BRING THEM TO THE OFFICE FOR PROCESSING.  PLEASE DO NOT GIVE THEM TO YOUR DOCTOR. ° °1. A prescription for pain medication may be given to you upon discharge.  Take your pain medication as prescribed, if needed.  If narcotic pain medicine is not needed, then you may take acetaminophen (Tylenol) or ibuprofen (Advil) as needed. °2. Take your usually prescribed medications unless otherwise directed. °3. If you need a refill on your pain medication, please contact your pharmacy. They will contact our office to request authorization.  Prescriptions will not be filled after 5pm or on week-ends. °4. You should follow a light diet the first few days after arrival home, such as soup and crackers, pudding, etc.unless your doctor has advised otherwise. A high-fiber, low fat diet can be resumed as tolerated.   Be sure to include lots of fluids daily. Most patients will experience some swelling and bruising on the chest and neck area.  Ice packs will help.  Swelling and bruising can take several days to resolve °5. Most patients will experience some swelling and bruising in the area of the incision. Ice pack will help. Swelling and bruising can take several days to resolve..  °6. It is common to experience some constipation if taking pain medication after surgery.  Increasing fluid intake and taking a stool softener will usually help or prevent this problem from occurring.  A mild laxative (Milk of Magnesia or Miralax) should be taken according to package directions if there are no bowel movements after 48 hours. °7.  You may have steri-strips (small skin tapes) in place directly over the incision.  These strips should be left on the skin for 7-10 days.  If your  surgeon used skin glue on the incision, you may shower in 24 hours.  The glue will flake off over the next 2-3 weeks.  Any sutures or staples will be removed at the office during your follow-up visit. You may find that a light gauze bandage over your incision may keep your staples from being rubbed or pulled. You may shower and replace the bandage daily. °8. ACTIVITIES:  You may resume regular (light) daily activities beginning the next day--such as daily self-care, walking, climbing stairs--gradually increasing activities as tolerated.  You may have sexual intercourse when it is comfortable.  Refrain from any heavy lifting or straining until approved by your doctor. °a. You may drive when you no longer are taking prescription pain medication, you can comfortably wear a seatbelt, and you can safely maneuver your car and apply brakes °b. Return to Work: ___________________________________ °9. You should see your doctor in the office for a follow-up appointment approximately two weeks after your surgery.  Make sure that you call for this appointment within a day or two after you arrive home to insure a convenient appointment time. °OTHER INSTRUCTIONS:  °_____________________________________________________________ °_____________________________________________________________ ° °WHEN TO CALL YOUR DOCTOR: °1. Fever over 101.0 °2. Inability to urinate °3. Nausea and/or vomiting °4. Extreme swelling or bruising °5. Continued bleeding from incision. °6. Increased pain, redness, or drainage from the incision. °7. Difficulty swallowing or breathing °8. Muscle cramping or spasms. °9. Numbness or tingling in hands or feet or around lips. ° °The clinic staff is available to   answer your questions during regular business hours.  Please dont hesitate to call and ask to speak to one of the nurses if you have concerns.  For further questions, please visit [email or web address]  1. PAIN CONTROL:  1. Pain is best  controlled by a usual combination of three different methods TOGETHER:  1. Ice/Heat 2. Over the counter pain medication 3. Prescription pain medication 2. Most patients will experience some swelling and bruising around wounds. Ice packs or heating pads (30-60 minutes up to 6 times a day) will help. Use ice for the first few days to help decrease swelling and bruising, then switch to heat to help relax tight/sore spots and speed recovery. Some people prefer to use ice alone, heat alone, alternating between ice & heat. Experiment to what works for you. Swelling and bruising can take several weeks to resolve.  3. It is helpful to take an over-the-counter pain medication regularly for the first few weeks. Choose one of the following that works best for you:  1. Naproxen (Aleve, etc) Two 220mg  tabs twice a day 2. Ibuprofen (Advil, etc) Three 200mg  tabs four times a day (every meal & bedtime) 3. Acetaminophen (Tylenol, etc) 500-650mg  four times a day (every meal & bedtime) 4. A prescription for pain medication (such as oxycodone, hydrocodone, etc) should be given to you upon discharge. Take your pain medication as prescribed.  1. If you are having problems/concerns with the prescription medicine (does not control pain, nausea, vomiting, rash, itching, etc), please call us [phone] to see if we need to switch you to a different pain medicine that will work better for you and/or control your side effect better. 2. If you need a refill on your pain medication, please contact your pharmacy. They will contact our office to request authorization. Prescriptions will not be filled after 5 pm or on week-ends. 4. Avoid getting constipated. When taking pain medications, it is common to experience some constipation. Increasing fluid intake and taking a fiber supplement (such as Metamucil, Citrucel, FiberCon, MiraLax, etc) 1-2 times a day regularly will usually help prevent this problem from occurring. A mild  laxative (prune juice, Milk of Magnesia, MiraLax, etc) should be taken according to package directions if there are no bowel movements after 48 hours.  5. Watch out for diarrhea. If you have many loose bowel movements, simplify your diet to bland foods & liquids for a few days. Stop any stool softeners and decrease your fiber supplement. Switching to mild anti-diarrheal medications (Kayopectate, Pepto Bismol) can help. If this worsens or does not improve, please call us. 6. Wash / shower every day. You may shower daily and replace your bandges after showering. No bathing or submerging your wounds in water until they heal. 7. FOLLOW UP in our office  1. Please call [facility] at [phone] to set up an appointment for a follow-up appointment approximately 2-3 weeks after discharge for wound check  WHEN TO CALL US [phone]:  1. Poor pain control 2. Reactions / problems with new medications (rash/itching, nausea, etc)  3. Fever over 101.5 F (38.5 C) 4. Worsening swelling or bruising 5. Continued bleeding from wounds. 6. Increased pain, redness, or drainage from the wounds which could be signs of infection  The clinic staff is available to answer your questions during regular business hours (8:30am-5pm). Please dont hesitate to call and ask to speak to one of our nurses for clinical concerns.  If you have a medical emergency, go to the nearest emergency  room or call 911.  A surgeon from [facility] is always on call at the [facility] [place]  [address], [place], [place] [address] ?  MAIN: [phone] ? TOLL FREE: [phone] ?  FAX [phone]  [email or web address]

## 2017-03-27 NOTE — Progress Notes (Signed)
Pt alert, orienting, ambulating, and tolerating diet.  D/C instructions were given and all questions were answered. Pt was d/cd home with spouse.

## 2017-05-26 ENCOUNTER — Other Ambulatory Visit (HOSPITAL_COMMUNITY): Payer: Self-pay | Admitting: Student

## 2017-05-26 ENCOUNTER — Telehealth: Payer: Self-pay | Admitting: Surgery

## 2017-05-26 ENCOUNTER — Ambulatory Visit (HOSPITAL_COMMUNITY)
Admission: RE | Admit: 2017-05-26 | Discharge: 2017-05-26 | Disposition: A | Discharge status: Home / Self Care | Payer: BLUE CROSS/BLUE SHIELD | Source: Ambulatory Visit | Attending: Student | Admitting: Student

## 2017-05-26 DIAGNOSIS — Z8719 Personal history of other diseases of the digestive system: Secondary | ICD-10-CM | POA: Diagnosis not present

## 2017-05-26 DIAGNOSIS — Z9889 Other specified postprocedural states: Secondary | ICD-10-CM | POA: Insufficient documentation

## 2017-05-26 NOTE — Telephone Encounter (Signed)
Ms. Erica Caldwell has had a colostomy reversal on 03/23/2017 by Dr. Gerrit FriendsGerkin. She has been back to the office a couple of times for abdominal pain.  She saw Puja today.   She may have a suture granuloma that Puja tried to find today.  Puja also ordered a CBC and KUB.  WBC was 8,600.  The KUB was non diagnostic.  I spoke to the patient on the phone about her labs and x-ray. If she is still hurting Monday, 4/29, she will call the office to follow up with Dr. Gerrit FriendsGerkin.  Erica Kinavid Marieta Markov, MD, [facility] Pager: [phone] Office phone:  [phone] ]

## 2017-07-19 ENCOUNTER — Ambulatory Visit: Payer: Self-pay | Admitting: Surgery

## 2017-07-24 ENCOUNTER — Encounter (HOSPITAL_COMMUNITY): Payer: Self-pay | Admitting: Surgery

## 2017-07-24 DIAGNOSIS — T8189XA Other complications of procedures, not elsewhere classified, initial encounter: Secondary | ICD-10-CM | POA: Diagnosis present

## 2017-07-24 NOTE — H&P (Signed)
[facility]  Erica Caldwell DOB: Jan 14, 1956 Married / Language: English / Race: White Female   History of Present Illness   The patient is a 62 year old female who presents for wound check.  CHIEF COMPLAINT: non-healing wound  Patient returns for wound check. Despite wound care and antibiotics, the upper portion of her midline abdominal incision will not remain healed. She has intermittent drainage. She presents today for evaluation and recommendations for management.   Problem List/Past Medical ABDOMINAL PAIN, LOWER (R10.30)  HISTORY OF COLOSTOMY REVERSAL (Z98.890)  DIVERTICULITIS OF COLON WITH PERFORATION (K57.20)  NON-HEALING SURGICAL WOUND (T81.89XA)  OPEN WOUND OF ABDOMEN (S31.109A)  WOUND CELLULITIS (L03.90)   Past Surgical History  Breast Biopsy  Right. Foot Surgery  Right. Gallbladder Surgery - Laparoscopic  Shoulder Surgery  Left. Thyroid Surgery  Tonsillectomy  Ventral / Umbilical Hernia Surgery  Bilateral.  Diagnostic Studies History Colonoscopy  within last year Mammogram  within last year Pap Smear  1-5 years ago  Allergies  Penicillin G Potassium *PENICILLINS*  Levamisole HCl *ANTINEOPLASTICS AND ADJUNCTIVE THERAPIES*  CeFAZolin in Sodium Chloride *CEPHALOSPORINS*  ChloraPrep One Step *ANTISEPTICS & DISINFECTANTS*  Latex   Medication History Bactrim DS (800-160MG  Tablet, 1 (one) Oral two times daily, Taken starting 06/14/2017) Active. Ventolin HFA (108 (90 Base)MCG/ACT Aerosol Soln, Inhalation) Active. Topiramate (100MG  Tablet, Oral) Active. TiZANidine HCl (4MG  Tablet, Oral) Active. PredniSONE (10MG  Tablet, Oral) Active. Methocarbamol (500MG  Tablet, Oral) Active. Levothyroxine Sodium ( Tablet, Oral) Active. Gabapentin (300MG  Capsule, Oral) Active. Lovastatin (40MG  Tablet, Oral) Active. Esomeprazole Magnesium (40MG  Capsule DR, Oral) Active. OxyCODONE HCl (5MG  Tablet,  Oral) Active. Hydrocodone-Acetaminophen (7.5-325MG  Tablet, Oral) Active. Levothyroxine Sodium ( Tablet, Oral) Active. MetFORMIN HCl ER (500MG  Tablet ER 24HR, Oral) Active. Montelukast Sodium (10MG  Tablet, Oral) Active. DULoxetine HCl (30MG  Capsule DR Part, Oral) Active. Vitamin C (500MG  Tablet, Oral) Active.  Social History  Alcohol use  Occasional alcohol use. Caffeine use  Carbonated beverages, Tea. No drug use  Tobacco use  Never smoker.  Family History  Arthritis  Father, Mother. Breast Cancer  Family Members In General. Colon Cancer  Father. Colon Polyps  Brother. Heart Disease  Brother, Family Members In General, Father, Mother. Hypertension  Father, Mother. Kidney Disease  Father.  Pregnancy / Birth History  Age at menarche  12 years. Age of menopause  52-50 Gravida  47 Maternal age  67-25 Para  3  Other Problems Anxiety Disorder  Arthritis  Asthma  Back Pain  Chest pain  Diverticulosis  Gastric Ulcer  Gastroesophageal Reflux Disease  General anesthesia - complications  Hemorrhoids  Melanoma  Migraine Headache  Other disease, cancer, significant illness  Thyroid Disease  Umbilical Hernia Repair   Vitals Weight: 139.2 lb Height: 61in Body Surface Area: 1.62 m Body Mass Index: 26.3 kg/m  Temp.: 97.42F  Pulse: 78 (Regular)  BP: 110/78 (Sitting, Left Arm, Standard)   Physical Exam  Limited examination  Incision over the colostomy site is well-healed. Midline incision is well healed except for a small 2-3 cm area in the upper portion of the wound where there is violaceous discoloration and bleb formation. On palpation there is obviously fluid beneath the incision. It is mildly tender.    Assessment & Plan   NON-HEALING SURGICAL WOUND (T81.89XA)  Patient has a nonhealing wound which has failed to resolve despite multiple attempts at management short of surgery. I suspect there is a chronic  infection or suture abscess present. This will require wound exploration under anesthesia  and removal of underlying suture material. We will plan to pack the wound open and treated with dressing changes allowing it to heal by secondary intention. Patient understands and agrees to proceed.  The risks and benefits of the procedure have been discussed at length with the patient. The patient understands the proposed procedure, potential alternative treatments, and the course of recovery to be expected. All of the patient's questions have been answered at this time. The patient wishes to proceed with surgery.  Erica Erica Erica Zebrowski, MD [facility] Office: [phone]

## 2017-07-25 NOTE — Progress Notes (Signed)
FAX REQUEST SENT TO DR Elisha HeadlandJU KIM OFFICE [facility] REQUESTING LOV NOTES, STRESS TEST, ECHO ,LAST EKG

## 2017-07-25 NOTE — Progress Notes (Signed)
LOV CARDIOLOGY DR Elisha HeadlandJU KIM 07-19-16 Epic

## 2017-07-25 NOTE — Patient Instructions (Addendum)
Erica Caldwell  07/25/2017   Your procedure is scheduled on: 07-27-17   [facility]    Report to admitting at 5:30AM    Call this number if you have problems the morning of surgery [phone]     Remember: Do not eat food or drink liquids :After Midnight.     Take these medicines the morning of surgery with A SIP OF WATER: DULOXETINE, LEVOTHYROXINE, DULERA INHALER , ALBUTEROL INHALER IF NEEDED (PLEASE BRING)                                You may not have any metal on your body including hair pins and              piercings  Do not wear jewelry, make-up, lotions, powders or perfumes, deodorant             Do not wear nail polish.  Do not shave  48 hours prior to surgery.        Do not bring valuables to the hospital. [name] IS NOT             RESPONSIBLE   FOR VALUABLES.  Contacts, dentures or bridgework may not be worn into surgery.       Patients discharged the day of surgery will not be allowed to drive home.  Name and phone number of your driver:  Special Instructions: N/A              Please read over the following fact sheets you were given: _____________________________________________________________________             [facility] - Preparing for Surgery:  PLEASE USE GOLD DIAL SOAP IN PLACE OF CHG WASH FOR ALLERGIES  Before surgery, you can play an important role.  Because skin is not sterile, your skin needs to be as free of germs as possible.  You can reduce the number of germs on your skin by washing with CHG (chlorahexidine gluconate) soap before surgery.  CHG is an antiseptic cleaner which kills germs and bonds with the skin to continue killing germs even after washing. Please DO NOT use if you have an allergy to CHG or antibacterial soaps.  If your skin becomes reddened/irritated stop using the CHG and inform your nurse when you arrive at Short Stay. Do not shave (including legs and underarms) for at  least 48 hours prior to the first CHG shower.  You may shave your face/neck. Please follow these instructions carefully:  1.  Shower with CHG Soap the night before surgery and the  morning of Surgery.  2.  If you choose to wash your hair, wash your hair first as usual with your  normal  shampoo.  3.  After you shampoo, rinse your hair and body thoroughly to remove the  shampoo.                           4.  Use CHG as you would any other liquid soap.  You can apply chg directly  to the skin and wash                       Gently with a scrungie or clean washcloth.  5.  Apply the CHG Soap  to your body ONLY FROM THE NECK DOWN.   Do not use on face/ open                           Wound or open sores. Avoid contact with eyes, ears mouth and genitals (private parts).                       Wash face,  Genitals (private parts) with your normal soap.             6.  Wash thoroughly, paying special attention to the area where your surgery  will be performed.  7.  Thoroughly rinse your body with warm water from the neck down.  8.  DO NOT shower/wash with your normal soap after using and rinsing off  the CHG Soap.                9.  Pat yourself dry with a clean towel.            10.  Wear clean pajamas.            11.  Place clean sheets on your bed the night of your first shower and do not  sleep with pets. Day of Surgery : Do not apply any lotions/deodorants the morning of surgery.  Please wear clean clothes to the hospital/surgery center.  FAILURE TO FOLLOW THESE INSTRUCTIONS MAY RESULT IN THE CANCELLATION OF YOUR SURGERY PATIENT SIGNATURE_________________________________  NURSE SIGNATURE__________________________________  ________________________________________________________________________

## 2017-07-26 ENCOUNTER — Other Ambulatory Visit: Payer: Self-pay

## 2017-07-26 ENCOUNTER — Encounter (HOSPITAL_COMMUNITY): Payer: Self-pay

## 2017-07-26 ENCOUNTER — Encounter (HOSPITAL_COMMUNITY): Payer: Self-pay | Admitting: Anesthesiology

## 2017-07-26 ENCOUNTER — Encounter (HOSPITAL_COMMUNITY)
Admission: RE | Admit: 2017-07-26 | Discharge: 2017-07-26 | Disposition: A | Discharge status: Home / Self Care | Payer: BLUE CROSS/BLUE SHIELD | Source: Ambulatory Visit | Attending: Surgery | Admitting: Surgery

## 2017-07-26 DIAGNOSIS — Z01812 Encounter for preprocedural laboratory examination: Secondary | ICD-10-CM | POA: Insufficient documentation

## 2017-07-26 DIAGNOSIS — Y839 Surgical procedure, unspecified as the cause of abnormal reaction of the patient, or of later complication, without mention of misadventure at the time of the procedure: Secondary | ICD-10-CM | POA: Insufficient documentation

## 2017-07-26 DIAGNOSIS — T8189XA Other complications of procedures, not elsewhere classified, initial encounter: Secondary | ICD-10-CM | POA: Insufficient documentation

## 2017-07-26 HISTORY — DX: Diverticulitis of intestine, part unspecified, without perforation or abscess without bleeding: K57.92

## 2017-07-26 HISTORY — DX: Cellulitis, unspecified: L03.90

## 2017-07-26 LAB — CBC
HEMATOCRIT: 46.6 % — AB (ref 36.0–46.0)
Hemoglobin: 14.7 g/dL (ref 12.0–15.0)
MCH: 29.6 pg (ref 26.0–34.0)
MCHC: 31.5 g/dL (ref 30.0–36.0)
MCV: 94 fL (ref 78.0–100.0)
Platelets: 287 10*3/uL (ref 150–400)
RBC: 4.96 MIL/uL (ref 3.87–5.11)
RDW: 17 % — ABNORMAL HIGH (ref 11.5–15.5)
WBC: 7.8 10*3/uL (ref 4.0–10.5)

## 2017-07-26 LAB — HEMOGLOBIN A1C
Hgb A1c MFr Bld: 5.5 % (ref 4.8–5.6)
MEAN PLASMA GLUCOSE: 111.15 mg/dL

## 2017-07-26 NOTE — Progress Notes (Signed)
STRESS TEST 08-09-16 ON CHART FROM [facility]   ECHO 06-01-16 ON CHART FROM [facility]   EKG 07-19-16 ON CHART FROM [facility]

## 2017-07-26 NOTE — Anesthesia Preprocedure Evaluation (Addendum)
Anesthesia Evaluation  Patient identified by MRN, date of birth, ID band Patient awake    Reviewed: Allergy & Precautions, NPO status , Patient's Chart, lab work & pertinent test results  Airway Mallampati: III  TM Distance: <3 FB Neck ROM: Full    Dental no notable dental hx. (+) Poor Dentition,    Pulmonary asthma ,    Pulmonary exam normal breath sounds clear to auscultation       Cardiovascular negative cardio ROS   Rhythm:Regular Rate:Normal     Neuro/Psych  Headaches, negative psych ROS   GI/Hepatic negative GI ROS, Neg liver ROS,   Endo/Other  negative endocrine ROS  Renal/GU negative Renal ROS  negative genitourinary   Musculoskeletal  (+) Arthritis , Osteoarthritis,  Fibromyalgia -  Abdominal (+) - obese,   Peds negative pediatric ROS (+)  Hematology negative hematology ROS (+)   Anesthesia Other Findings   Reproductive/Obstetrics negative OB ROS                                                            Anesthesia Evaluation  Patient identified by MRN, date of birth, ID band Patient awake    Reviewed: Allergy & Precautions, NPO status , Patient's Chart, lab work & pertinent test results  Airway Mallampati: II  TM Distance: >3 FB Neck ROM: Full    Dental no notable dental hx. (+) Poor Dentition, Dental Advidsory Given   Pulmonary neg pulmonary ROS, asthma ,    Pulmonary exam normal breath sounds clear to auscultation       Cardiovascular negative cardio ROS Normal cardiovascular exam Rhythm:Regular Rate:Normal     Neuro/Psych  Headaches, negative neurological ROS  negative psych ROS   GI/Hepatic negative GI ROS, Neg liver ROS,   Endo/Other  negative endocrine ROS  Renal/GU negative Renal ROS  negative genitourinary   Musculoskeletal negative musculoskeletal ROS (+) Arthritis , Osteoarthritis,  Fibromyalgia -  Abdominal (+) + obese,    Peds negative pediatric ROS (+)  Hematology negative hematology ROS (+)   Anesthesia Other Findings Hx of perforated bowel; now here for closure 150 Propofol  Reproductive/Obstetrics negative OB ROS                           Lab Results  Component Value Date   WBC PENDING 07/26/2017   HGB 14.7 07/26/2017   HCT 46.6 (H) 07/26/2017   MCV 94.0 07/26/2017   PLT 287 07/26/2017    Anesthesia Physical  Anesthesia Plan  ASA: III  Anesthesia Plan: General   Post-op Pain Management:    Induction: Intravenous  PONV Risk Score and Plan: 3 and Ondansetron, Dexamethasone and Midazolam  Airway Management Planned: Oral ETT  Additional Equipment: None  Intra-op Plan:   Post-operative Plan: Extubation in OR and Possible Post-op intubation/ventilation  Informed Consent: I have reviewed the patients History and Physical, chart, labs and discussed the procedure including the risks, benefits and alternatives for the proposed anesthesia with the patient or authorized representative who has indicated his/her understanding and acceptance.   Dental advisory given  Plan Discussed with: CRNA  Anesthesia Plan Comments:         Anesthesia Quick Evaluation  Anesthesia Physical  Anesthesia Plan  ASA: III  Anesthesia Plan: General  Post-op Pain Management:    Induction: Intravenous  PONV Risk Score and Plan: 3 and Ondansetron, Dexamethasone, Midazolam and Treatment may vary due to age or medical condition  Airway Management Planned: Oral ETT  Additional Equipment: None  Intra-op Plan:   Post-operative Plan: Extubation in OR  Informed Consent: I have reviewed the patients History and Physical, chart, labs and discussed the procedure including the risks, benefits and alternatives for the proposed anesthesia with the patient or authorized representative who has indicated his/her understanding and acceptance.   Dental advisory given  Plan  Discussed with: CRNA and Surgeon  Anesthesia Plan Comments:     Anesthesia Quick Evaluation

## 2017-07-27 ENCOUNTER — Ambulatory Visit (HOSPITAL_COMMUNITY)
Admission: RE | Admit: 2017-07-27 | Discharge: 2017-07-27 | Disposition: A | Discharge status: Home / Self Care | Payer: BLUE CROSS/BLUE SHIELD | Source: Ambulatory Visit | Attending: Surgery | Admitting: Surgery

## 2017-07-27 ENCOUNTER — Ambulatory Visit (HOSPITAL_COMMUNITY): Payer: BLUE CROSS/BLUE SHIELD | Admitting: Anesthesiology

## 2017-07-27 ENCOUNTER — Encounter (HOSPITAL_COMMUNITY)
Admission: RE | Disposition: A | Discharge status: Home / Self Care | Payer: Self-pay | Source: Ambulatory Visit | Attending: Surgery

## 2017-07-27 ENCOUNTER — Encounter (HOSPITAL_COMMUNITY): Payer: Self-pay | Admitting: *Deleted

## 2017-07-27 DIAGNOSIS — Z880 Allergy status to penicillin: Secondary | ICD-10-CM | POA: Insufficient documentation

## 2017-07-27 DIAGNOSIS — J45909 Unspecified asthma, uncomplicated: Secondary | ICD-10-CM | POA: Diagnosis not present

## 2017-07-27 DIAGNOSIS — Z888 Allergy status to other drugs, medicaments and biological substances status: Secondary | ICD-10-CM | POA: Insufficient documentation

## 2017-07-27 DIAGNOSIS — Y838 Other surgical procedures as the cause of abnormal reaction of the patient, or of later complication, without mention of misadventure at the time of the procedure: Secondary | ICD-10-CM | POA: Diagnosis not present

## 2017-07-27 DIAGNOSIS — Z85820 Personal history of malignant melanoma of skin: Secondary | ICD-10-CM | POA: Diagnosis not present

## 2017-07-27 DIAGNOSIS — Z91040 Latex allergy status: Secondary | ICD-10-CM | POA: Insufficient documentation

## 2017-07-27 DIAGNOSIS — T8189XA Other complications of procedures, not elsewhere classified, initial encounter: Secondary | ICD-10-CM | POA: Diagnosis present

## 2017-07-27 DIAGNOSIS — M1990 Unspecified osteoarthritis, unspecified site: Secondary | ICD-10-CM | POA: Insufficient documentation

## 2017-07-27 DIAGNOSIS — F419 Anxiety disorder, unspecified: Secondary | ICD-10-CM | POA: Insufficient documentation

## 2017-07-27 DIAGNOSIS — Z79899 Other long term (current) drug therapy: Secondary | ICD-10-CM | POA: Diagnosis not present

## 2017-07-27 DIAGNOSIS — K219 Gastro-esophageal reflux disease without esophagitis: Secondary | ICD-10-CM | POA: Diagnosis not present

## 2017-07-27 DIAGNOSIS — Z8719 Personal history of other diseases of the digestive system: Secondary | ICD-10-CM | POA: Diagnosis not present

## 2017-07-27 DIAGNOSIS — M797 Fibromyalgia: Secondary | ICD-10-CM | POA: Insufficient documentation

## 2017-07-27 DIAGNOSIS — Z881 Allergy status to other antibiotic agents status: Secondary | ICD-10-CM | POA: Insufficient documentation

## 2017-07-27 HISTORY — PX: WOUND EXPLORATION: SHX6188

## 2017-07-27 SURGERY — WOUND EXPLORATION
Anesthesia: General | Site: Abdomen

## 2017-07-27 MED ORDER — MEPERIDINE HCL 50 MG/ML IJ SOLN: 6.2500 mg | INTRAMUSCULAR | Status: DC | PRN

## 2017-07-27 MED ORDER — OXYCODONE HCL 5 MG PO TABS: 5.0000 mg | ORAL_TABLET | Freq: Four times a day (QID) | ORAL | 0 refills | Status: AC | PRN

## 2017-07-27 MED ORDER — PROMETHAZINE HCL 25 MG/ML IJ SOLN: 6.2500 mg | INTRAMUSCULAR | Status: DC | PRN

## 2017-07-27 MED ORDER — CHLORHEXIDINE GLUCONATE CLOTH 2 % EX PADS: 6.0000 | MEDICATED_PAD | Freq: Once | CUTANEOUS | Status: DC

## 2017-07-27 MED ORDER — LACTATED RINGERS IV SOLN
INTRAVENOUS | Status: DC
  Administered 2017-07-27: 06:00:00 via INTRAVENOUS

## 2017-07-27 MED ORDER — HYDROMORPHONE HCL 1 MG/ML IJ SOLN: 0.2500 mg | INTRAMUSCULAR | Status: DC | PRN

## 2017-07-27 MED ORDER — ACETAMINOPHEN 10 MG/ML IV SOLN
INTRAVENOUS | Status: AC
  Filled 2017-07-27: qty 100

## 2017-07-27 MED ORDER — PROPOFOL 10 MG/ML IV BOLUS
INTRAVENOUS | Status: DC | PRN
  Administered 2017-07-27: 130 mg via INTRAVENOUS

## 2017-07-27 MED ORDER — ACETAMINOPHEN 325 MG PO TABS
ORAL_TABLET | ORAL | Status: AC
  Filled 2017-07-27: qty 2

## 2017-07-27 MED ORDER — DEXAMETHASONE SODIUM PHOSPHATE 10 MG/ML IJ SOLN
INTRAMUSCULAR | Status: AC
  Filled 2017-07-27: qty 1

## 2017-07-27 MED ORDER — ROCURONIUM BROMIDE 100 MG/10ML IV SOLN
INTRAVENOUS | Status: AC
  Filled 2017-07-27: qty 1

## 2017-07-27 MED ORDER — EPHEDRINE SULFATE-NACL 50-0.9 MG/10ML-% IV SOSY
PREFILLED_SYRINGE | INTRAVENOUS | Status: DC | PRN
  Administered 2017-07-27 (×2): 15 mg via INTRAVENOUS

## 2017-07-27 MED ORDER — HYDROCODONE-ACETAMINOPHEN 7.5-325 MG PO TABS: 1.0000 | ORAL_TABLET | Freq: Once | ORAL | Status: DC | PRN

## 2017-07-27 MED ORDER — EPHEDRINE 5 MG/ML INJ
INTRAVENOUS | Status: AC
  Filled 2017-07-27: qty 10

## 2017-07-27 MED ORDER — LIDOCAINE 2% (20 MG/ML) 5 ML SYRINGE
INTRAMUSCULAR | Status: DC | PRN
  Administered 2017-07-27: 100 mg via INTRAVENOUS

## 2017-07-27 MED ORDER — ONDANSETRON HCL 4 MG/2ML IJ SOLN
INTRAMUSCULAR | Status: AC
  Filled 2017-07-27: qty 2

## 2017-07-27 MED ORDER — MIDAZOLAM HCL 5 MG/5ML IJ SOLN
INTRAMUSCULAR | Status: DC | PRN
  Administered 2017-07-27: 2 mg via INTRAVENOUS

## 2017-07-27 MED ORDER — ACETAMINOPHEN 10 MG/ML IV SOLN: 1000.0000 mg | Freq: Once | INTRAVENOUS | Status: DC | PRN

## 2017-07-27 MED ORDER — FENTANYL CITRATE (PF) 250 MCG/5ML IJ SOLN
INTRAMUSCULAR | Status: DC | PRN
  Administered 2017-07-27: 50 ug via INTRAVENOUS

## 2017-07-27 MED ORDER — VANCOMYCIN HCL IN DEXTROSE 1-5 GM/200ML-% IV SOLN
1000.0000 mg | INTRAVENOUS | Status: AC
  Administered 2017-07-27: 1000 mg via INTRAVENOUS
  Filled 2017-07-27: qty 200

## 2017-07-27 MED ORDER — LIDOCAINE 2% (20 MG/ML) 5 ML SYRINGE
INTRAMUSCULAR | Status: AC
  Filled 2017-07-27: qty 5

## 2017-07-27 MED ORDER — PROPOFOL 10 MG/ML IV BOLUS
INTRAVENOUS | Status: AC
  Filled 2017-07-27: qty 20

## 2017-07-27 MED ORDER — ACETAMINOPHEN 10 MG/ML IV SOLN
INTRAVENOUS | Status: DC | PRN
  Administered 2017-07-27: 1000 mg via INTRAVENOUS

## 2017-07-27 MED ORDER — ACETAMINOPHEN 325 MG PO TABS: 650.0000 mg | ORAL_TABLET | Freq: Once | ORAL | Status: DC

## 2017-07-27 MED ORDER — DEXAMETHASONE SODIUM PHOSPHATE 10 MG/ML IJ SOLN
INTRAMUSCULAR | Status: DC | PRN
  Administered 2017-07-27: 10 mg via INTRAVENOUS

## 2017-07-27 MED ORDER — 0.9 % SODIUM CHLORIDE (POUR BTL) OPTIME
TOPICAL | Status: DC | PRN
  Administered 2017-07-27: 1000 mL

## 2017-07-27 MED ORDER — MIDAZOLAM HCL 2 MG/2ML IJ SOLN
INTRAMUSCULAR | Status: AC
  Filled 2017-07-27: qty 2

## 2017-07-27 MED ORDER — FENTANYL CITRATE (PF) 250 MCG/5ML IJ SOLN
INTRAMUSCULAR | Status: AC
  Filled 2017-07-27: qty 5

## 2017-07-27 MED ORDER — ONDANSETRON HCL 4 MG/2ML IJ SOLN
INTRAMUSCULAR | Status: DC | PRN
  Administered 2017-07-27: 4 mg via INTRAVENOUS

## 2017-07-27 SURGICAL SUPPLY — 33 items
BLADE HEX COATED 2.75 (ELECTRODE) ×3 IMPLANT
BLADE SURG SZ10 CARB STEEL (BLADE) ×3 IMPLANT
CHLORAPREP W/TINT 26ML (MISCELLANEOUS) IMPLANT
CLOSURE WOUND 1/2 X4 (GAUZE/BANDAGES/DRESSINGS)
DECANTER SPIKE VIAL GLASS SM (MISCELLANEOUS) IMPLANT
DRAIN CHANNEL RND F F (WOUND CARE) IMPLANT
DRAPE LAPAROTOMY T 102X78X121 (DRAPES) ×3 IMPLANT
DRAPE LAPAROTOMY TRNSV 102X78 (DRAPE) IMPLANT
DRAPE SHEET LG 3/4 BI-LAMINATE (DRAPES) IMPLANT
ELECT REM PT RETURN 15FT ADLT (MISCELLANEOUS) ×3 IMPLANT
EVACUATOR SILICONE 100CC (DRAIN) IMPLANT
GAUZE SPONGE 4X4 12PLY STRL (GAUZE/BANDAGES/DRESSINGS) ×3 IMPLANT
GLOVE BIOGEL PI IND STRL 7.0 (GLOVE) ×1 IMPLANT
GLOVE BIOGEL PI INDICATOR 7.0 (GLOVE) ×2
GLOVE SURG ORTHO 8.0 STRL STRW (GLOVE) ×3 IMPLANT
GOWN STRL REUS W/TWL LRG LVL3 (GOWN DISPOSABLE) ×3 IMPLANT
GOWN STRL REUS W/TWL XL LVL3 (GOWN DISPOSABLE) ×6 IMPLANT
KIT BASIN OR (CUSTOM PROCEDURE TRAY) ×3 IMPLANT
MARKER SKIN DUAL TIP RULER LAB (MISCELLANEOUS) IMPLANT
NEEDLE HYPO 25X1 1.5 SAFETY (NEEDLE) IMPLANT
NS IRRIG 1000ML POUR BTL (IV SOLUTION) ×3 IMPLANT
PACK GENERAL/GYN (CUSTOM PROCEDURE TRAY) ×3 IMPLANT
PAD ABD 8X10 STRL (GAUZE/BANDAGES/DRESSINGS) ×3 IMPLANT
SPONGE LAP 18X18 RF (DISPOSABLE) ×3 IMPLANT
STAPLER VISISTAT 35W (STAPLE) IMPLANT
STRIP CLOSURE SKIN 1/2X4 (GAUZE/BANDAGES/DRESSINGS) IMPLANT
SUT ETHILON 3 0 PS 1 (SUTURE) IMPLANT
SUT MNCRL AB 4-0 PS2 18 (SUTURE) IMPLANT
SUT VIC AB 3-0 SH 18 (SUTURE) IMPLANT
SYR CONTROL 10ML LL (SYRINGE) IMPLANT
TAPE CLOTH SURG 6X10 WHT LF (GAUZE/BANDAGES/DRESSINGS) ×3 IMPLANT
TOWEL OR 17X26 10 PK STRL BLUE (TOWEL DISPOSABLE) ×3 IMPLANT
TOWEL OR NON WOVEN STRL DISP B (DISPOSABLE) ×3 IMPLANT

## 2017-07-27 NOTE — Anesthesia Procedure Notes (Signed)
Procedure Name: LMA Insertion Date/Time: 07/27/2017 7:27 AM Performed by: Jhonnie GarnerMarshall, Elise Knobloch M, CRNA Pre-anesthesia Checklist: Patient identified, Emergency Drugs available, Suction available and Patient being monitored Patient Re-evaluated:Patient Re-evaluated prior to induction Oxygen Delivery Method: Circle system utilized Preoxygenation: Pre-oxygenation with 100% oxygen Induction Type: IV induction Ventilation: Mask ventilation without difficulty LMA: LMA inserted LMA Size: 3.0 Number of attempts: 1 Placement Confirmation: positive ETCO2 and breath sounds checked- equal and bilateral Tube secured with: Tape Dental Injury: Teeth and Oropharynx as per pre-operative assessment

## 2017-07-27 NOTE — Transfer of Care (Signed)
Immediate Anesthesia Transfer of Care Note  Patient: Erica Caldwell  Procedure(s) Performed: ABDOMINAL WOUND EXPLORATION, REMOVAL SUTURE MATERIAL (N/A Abdomen)  Patient Location: PACU  Anesthesia Type:General  Level of Consciousness: awake, alert  and oriented  Airway & Oxygen Therapy: Patient Spontanous Breathing and Patient connected to face mask oxygen  Post-op Assessment: Report given to RN and Post -op Vital signs reviewed and stable  Post vital signs: Reviewed and stable  Last Vitals:  Vitals Value Taken Time  BP 118/58 07/27/2017  8:00 AM  Temp    Pulse 97 07/27/2017  8:01 AM  Resp 14 07/27/2017  8:01 AM  SpO2 100 % 07/27/2017  8:01 AM  Vitals shown include unvalidated device data.  Last Pain:  Vitals:   07/27/17 0633  TempSrc: Oral      Patients Stated Pain Goal: 3 (07/27/17 0604)  Complications: No apparent anesthesia complications

## 2017-07-27 NOTE — Interval H&P Note (Signed)
History and Physical Interval Note:  07/27/2017 7:06 AM  Erica Caldwell  has presented today for surgery, with the diagnosis of NON HEALING SURGICAL WOUND MIDLINE OF ABDOMEN  The various methods of treatment have been discussed with the patient and family. After consideration of risks, benefits and other options for treatment, the patient has consented to    Procedure(s): ABDOMINAL WOUND EXPLORATION, REMOVAL SUTURE MATERIAL (N/A) as a surgical intervention .    The patient's history has been reviewed, patient examined, no change in status, stable for surgery.  I have reviewed the patient's chart and labs.  Questions were answered to the patient's satisfaction.    Darnell Levelodd Eulis Salazar, MD [facility] Office: [phone]   [name] MJudie Petit

## 2017-07-27 NOTE — Brief Op Note (Signed)
07/27/2017  7:52 AM  PATIENT:  Erica Caldwell  62 y.o. female  PRE-OPERATIVE DIAGNOSIS:  NON HEALING SURGICAL WOUND MIDLINE OF ABDOMEN  POST-OPERATIVE DIAGNOSIS:  * No post-op diagnosis entered *  PROCEDURE:  Procedure(s): ABDOMINAL WOUND EXPLORATION, REMOVAL SUTURE MATERIAL (N/A)  SURGEON:  Surgeon(s) and Role:    * Darnell LevelGerkin, Gottlieb Zuercher, MD - Primary  ANESTHESIA:   general  EBL:  5 mL   BLOOD ADMINISTERED:none  DRAINS: none   LOCAL MEDICATIONS USED:  NONE  SPECIMEN:  No Specimen  DISPOSITION OF SPECIMEN:  N/A  COUNTS:  YES  TOURNIQUET:  * No tourniquets in log *  DICTATION: .Other Dictation: Dictation Number [id number]  PLAN OF CARE: Discharge to home after PACU  PATIENT DISPOSITION:  PACU - hemodynamically stable.   Delay start of Pharmacological VTE agent (>24hrs) due to surgical blood loss or risk of bleeding: yes  Darnell Levelodd Ryland Tungate, MD [facility] Office: [phone]

## 2017-07-27 NOTE — Anesthesia Postprocedure Evaluation (Signed)
Anesthesia Post Note  Patient: Erica Caldwell  Procedure(s) Performed: ABDOMINAL WOUND EXPLORATION, REMOVAL SUTURE MATERIAL (N/A Abdomen)     Patient location during evaluation: PACU Anesthesia Type: General Level of consciousness: awake and alert Pain management: pain level controlled Vital Signs Assessment: post-procedure vital signs reviewed and stable Respiratory status: spontaneous breathing, nonlabored ventilation, respiratory function stable and patient connected to nasal cannula oxygen Cardiovascular status: blood pressure returned to baseline and stable Postop Assessment: no apparent nausea or vomiting Anesthetic complications: no    Last Vitals:  Vitals:   07/27/17 0845 07/27/17 0904  BP: (!) 107/53 120/73  Pulse: 78 83  Resp: 18 16  Temp: 36.5 C 36.5 C  SpO2: 92% 98%    Last Pain:  Vitals:   07/27/17 0904  TempSrc:   PainSc: 2                  Trevor IhaStephen A Axie Hayne

## 2017-07-27 NOTE — Op Note (Signed)
NAMNoel Journey: Caldwell, Erica Caldwell MEDICAL RECORD [id number] ACCOUNT [id number] DATE OF BIRTH:01/24/1956 FACILITY: [facility] LOCATION: [facility] PHYSICIAN:Mykael Batz Molly MaduroM. Gabby Rackers, MD  OPERATIVE REPORT  DATE OF PROCEDURE:  07/27/2017  PREOPERATIVE DIAGNOSIS:  Nonhealing abdominal wound.    POSTOPERATIVE DIAGNOSIS:  Nonhealing abdominal wound.    PROCEDURE:  Abdominal wound exploration with removal of suture material, open packing.    ASSISTANT:  Velora Hecklerodd M. Danylle Ouk, M.D.    ANESTHESIA:  General.    ESTIMATED BLOOD LOSS:  Minimal.    PREPARATION:  Betadine.    COMPLICATIONS:  None.    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  The patient had undergone colostomy closure.  Midline abdominal wound had healed completely except for a small area  at the superior aspect of the wound, which had chronic inflammatory changes and intermittent drainage.  Despite treatment with antibiotics and local wound care, the wound has not healed.  She now comes to the operating room for abdominal wound  exploration and removal of underlying suture material.    DESCRIPTION OF PROCEDURE:  The patient was brought to the operating room and placed in a supine position on the operating room table.  Following administration of general anesthesia, the patient was prepped and draped in the usual aseptic fashion.  After ascertaining that an  adequate level of anesthesia had been achieved, the upper portion of the midline incision is reopened with the electrocautery.  There is a sinus tract with inflammatory changes which is excised in its entirety using the electrocautery for hemostasis.   Dissection was carried through the subcutaneous tissues down to the fascia.  Suture material was identified.  Three of the Novafil sutures are removed.  However, there is tunneling of inflammatory tissue and granulation tissue.  These tunnels are opened.   At the  base of each tunnel appears to be an Ethibond suture. Three Ethibond sutures are extracted.  No further areas of inflammation are identified.  The wound was irrigated with warm saline and hemostasis achieved with the electrocautery.  The wound is packed with  4 x 4 gauze sponges saturated with Betadine solution.  Dry gauze dressings are applied.    The patient is awakened from anesthesia and brought to the recovery room.  The patient tolerated the procedure well.  Darnell Levelodd Paralee Pendergrass, MD [facility] [place] [facility] Office: [phone]    AN/NUANCE  D:07/27/2017 T:07/27/2017 JOB:[id number]

## 2017-07-28 ENCOUNTER — Encounter (HOSPITAL_COMMUNITY): Payer: Self-pay | Admitting: Surgery

## 2017-08-28 ENCOUNTER — Ambulatory Visit: Payer: BLUE CROSS/BLUE SHIELD | Admitting: Nurse Practitioner

## 2017-11-12 ENCOUNTER — Other Ambulatory Visit: Payer: Self-pay | Admitting: Neurology

## 2018-01-08 IMAGING — CT CT RENAL STONE PROTOCOL
2 of 3 series · 16 of 46 positions shown, 18 images · non-contrast
Comparison: None.

CLINICAL DATA: Left lower quadrant pain for 2 days. Frequent
urination.

EXAM:
CT ABDOMEN AND PELVIS WITHOUT CONTRAST
TECHNIQUE: Multidetector CT imaging of the abdomen and pelvis was performed
following the standard protocol without IV contrast.

[Series 3: lung · axial · 0.74mm/px · z∈[+262,+344]mm · 13 of 49 slices shown, 15 images]
[im 4/49  soft-tissue]
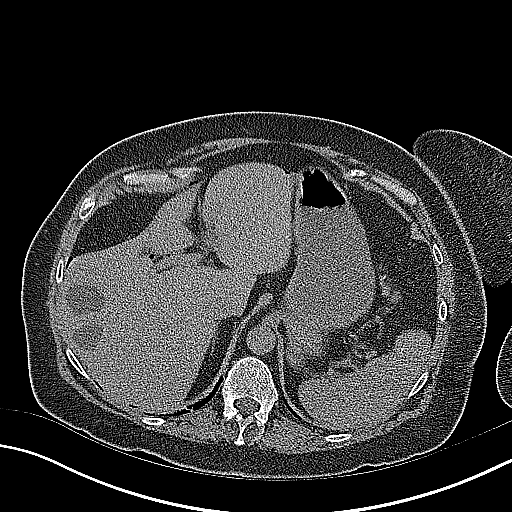
[im 4/49  bone]
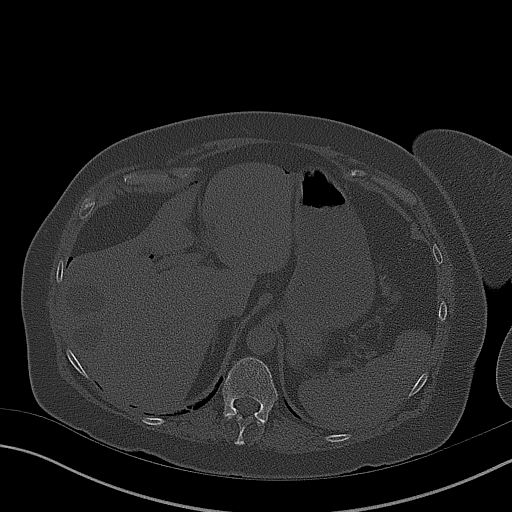
[im 7/49  soft-tissue]
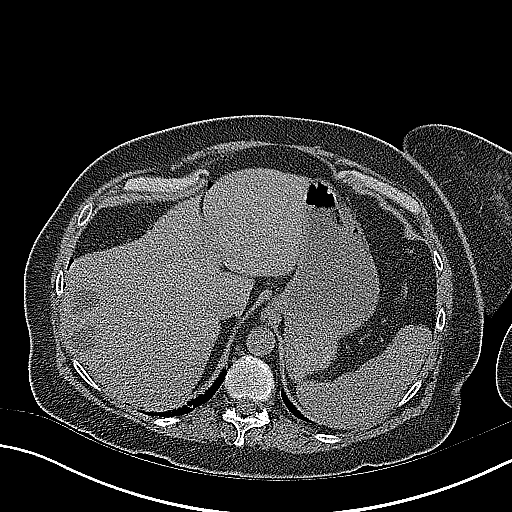
[im 10/49  soft-tissue]
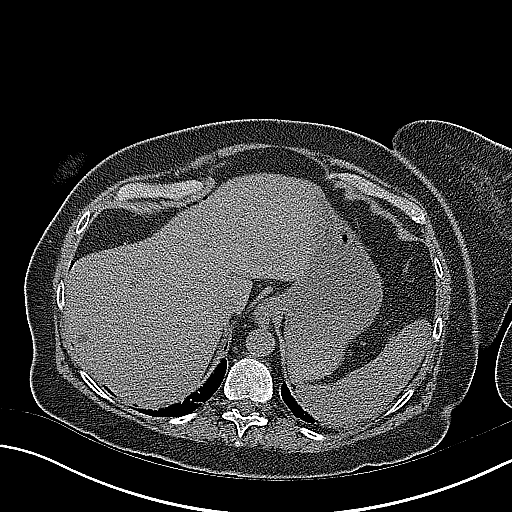
[im 14/49  soft-tissue]
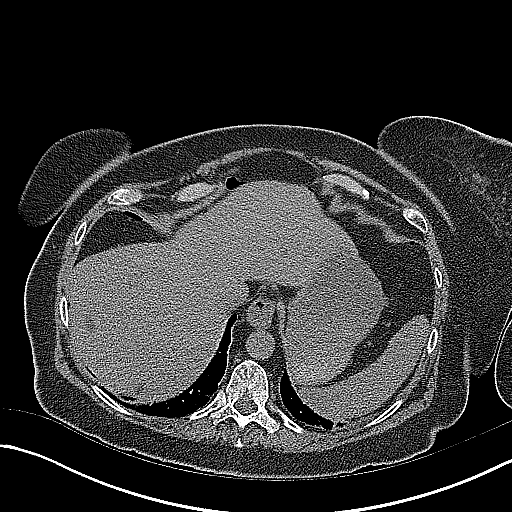
[im 18/49  soft-tissue]
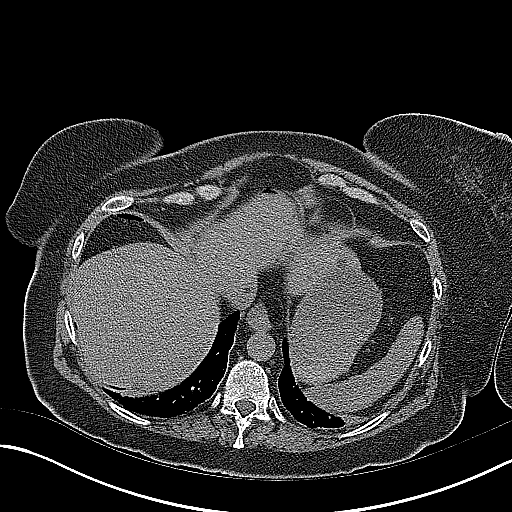
[im 21/49  soft-tissue]
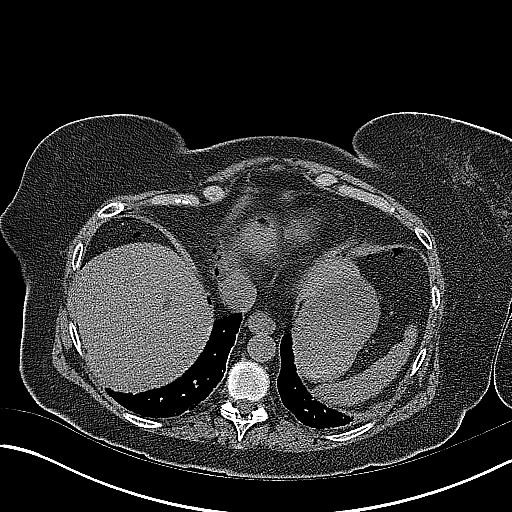
[im 25/49  soft-tissue]
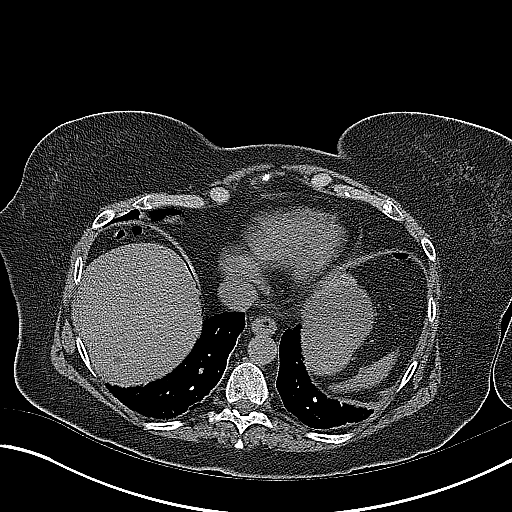
[im 28/49  soft-tissue]
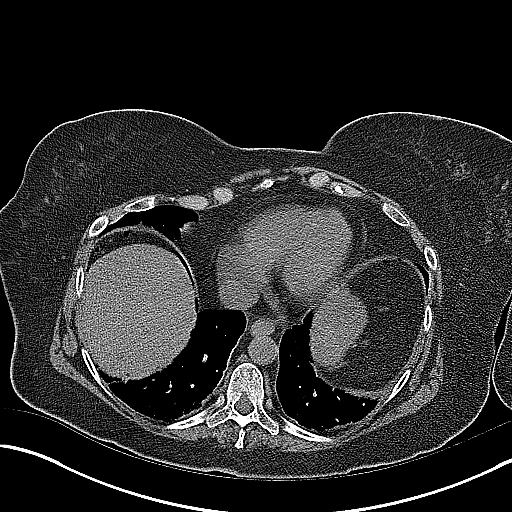
[im 31/49  soft-tissue]
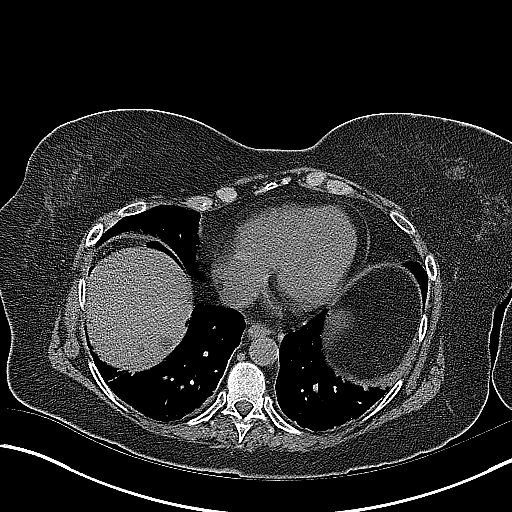
[im 31/49  bone]
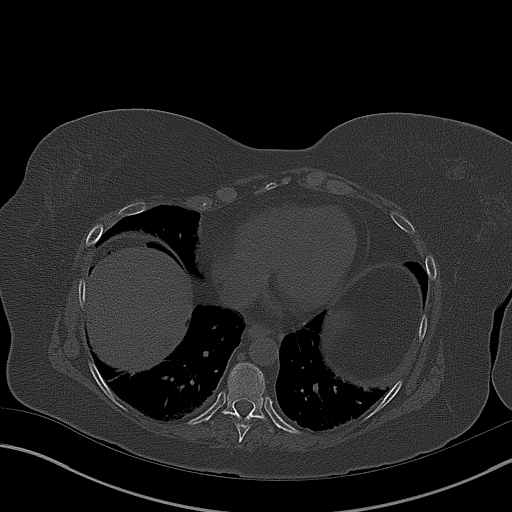
[im 35/49  soft-tissue]
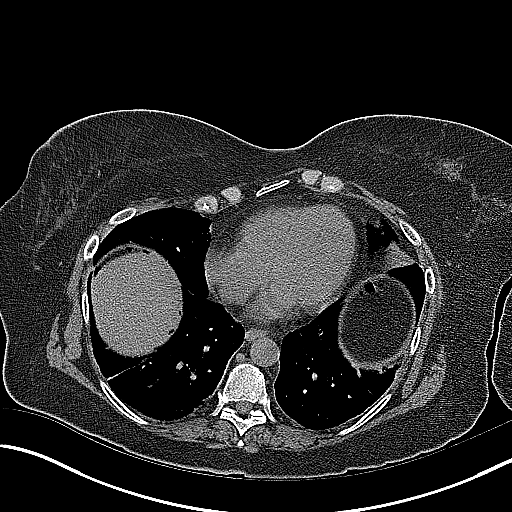
[im 39/49  soft-tissue]
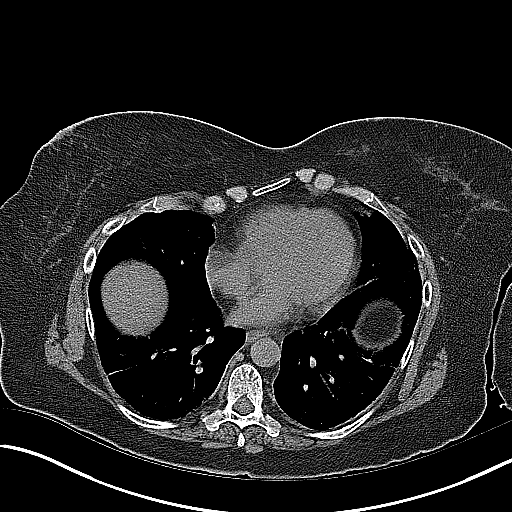
[im 42/49  soft-tissue]
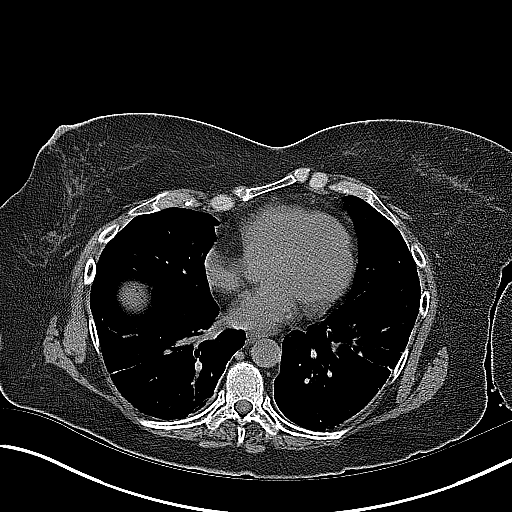
[im 45/49  soft-tissue]
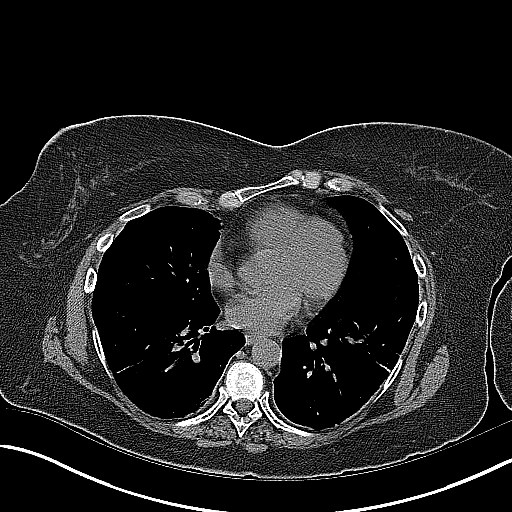

[Series 4: coronal · coronal · 0.68mm/px · 3 of 140 slices shown]
[im 47/140  soft-tissue]
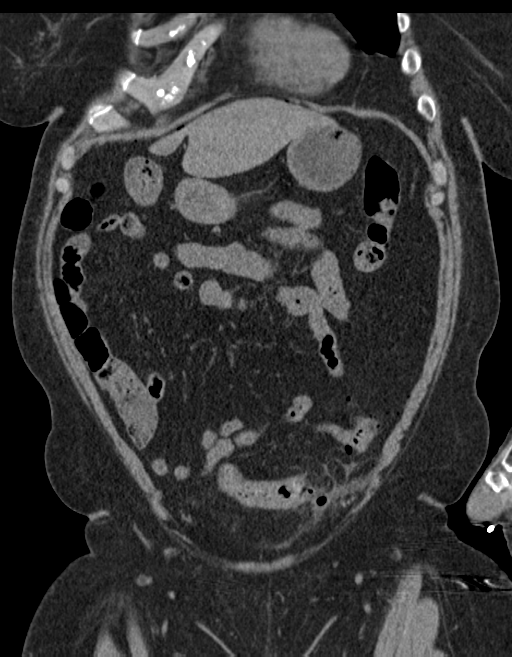
[im 62/140  soft-tissue]
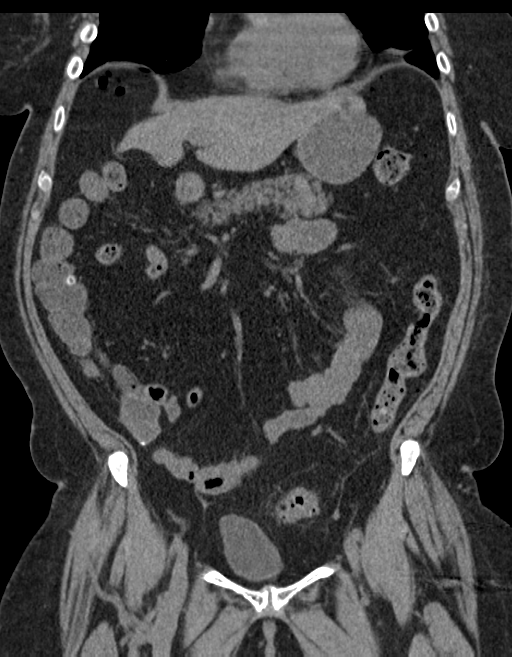
[im 78/140  soft-tissue]
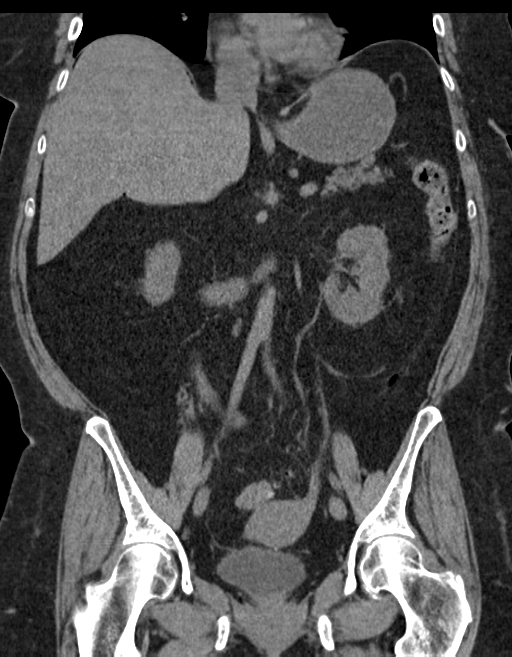

[16 of 46 positions shown; findings below may reference images not displayed]

FINDINGS: Lower chest: Linear scarring in the lung bases.

Hepatobiliary: Surgical absence of the gallbladder. No bile duct
dilatation. Multiple low-attenuation lesions throughout the liver.
Largest measures about 2.7 cm in diameter. Lesions likely represent
cysts although hemangiomas or cystic metastasis would not be
excluded.

Pancreas: Unremarkable. No pancreatic ductal dilatation or
surrounding inflammatory changes.

Spleen: Normal in size without focal abnormality.

Adrenals/Urinary Tract: No adrenal gland nodules. Kidneys are
symmetrical. No hydronephrosis or hydroureter. No renal, ureteral,
or bladder stones. Bladder wall is not thickened.

Stomach/Bowel: Stomach is unremarkable. Small bowel are
decompressed. Scattered stool throughout the colon without
distention. Diverticulosis of the sigmoid colon with inflammatory
infiltration and pericolonic gas consistent with acute
diverticulitis. No abscess. There is diffuse free air throughout the
abdomen and pelvis, likely due to perforation from the
diverticulitis. Appendix is surgically absent.

Vascular/Lymphatic: No significant vascular findings are present. No
enlarged abdominal or pelvic lymph nodes.

Reproductive: Uterus and bilateral adnexa are unremarkable.

Other: No free fluid in the abdomen. Abdominal wall musculature
appears intact.

Musculoskeletal: No acute or significant osseous findings.
IMPRESSION: 1. Perforated diverticulitis of the sigmoid colon with free air
throughout the abdomen and pelvis. No abscess.
2. Multiple low-attenuation liver lesions, likely representing
cysts. Consider follow-up with elective contrast-enhanced study for
better characterization.
3. Surgical absence of the gallbladder.
These results were called by telephone at the time of interpretation
on 11/02/2016 at [DATE] to Dr. GARGI TIGER , who verbally
acknowledged these results.

## 2018-02-10 ENCOUNTER — Other Ambulatory Visit: Payer: Self-pay | Admitting: Neurology

## 2018-08-01 IMAGING — CR DG ABDOMEN 2V
2 series · 2 of 2 positions shown · non-contrast
Comparison: CT 11/12/2016

CLINICAL DATA: Status post colostomy removal with right and left
lower quadrant pain

EXAM:
ABDOMEN - 2 VIEW

[abdomen erect]
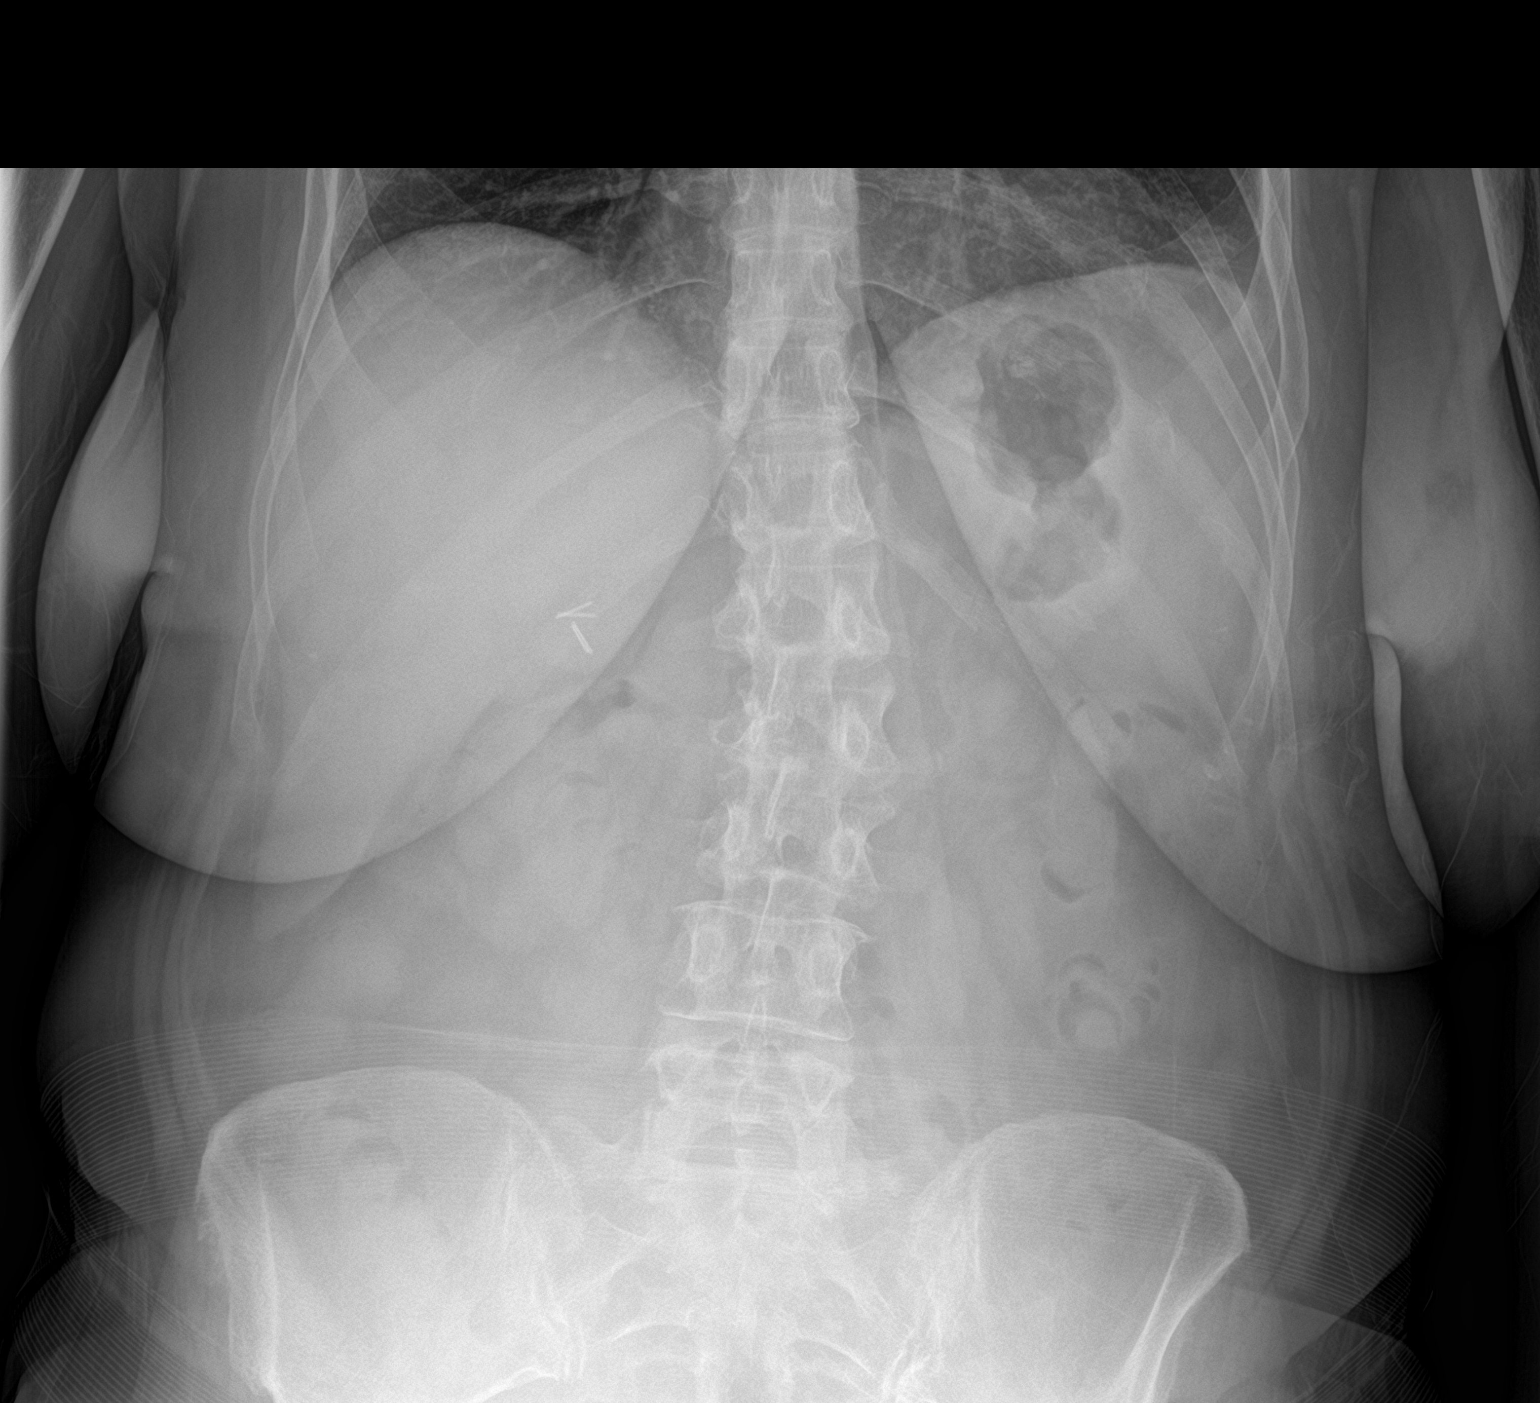

[abdomen supine]
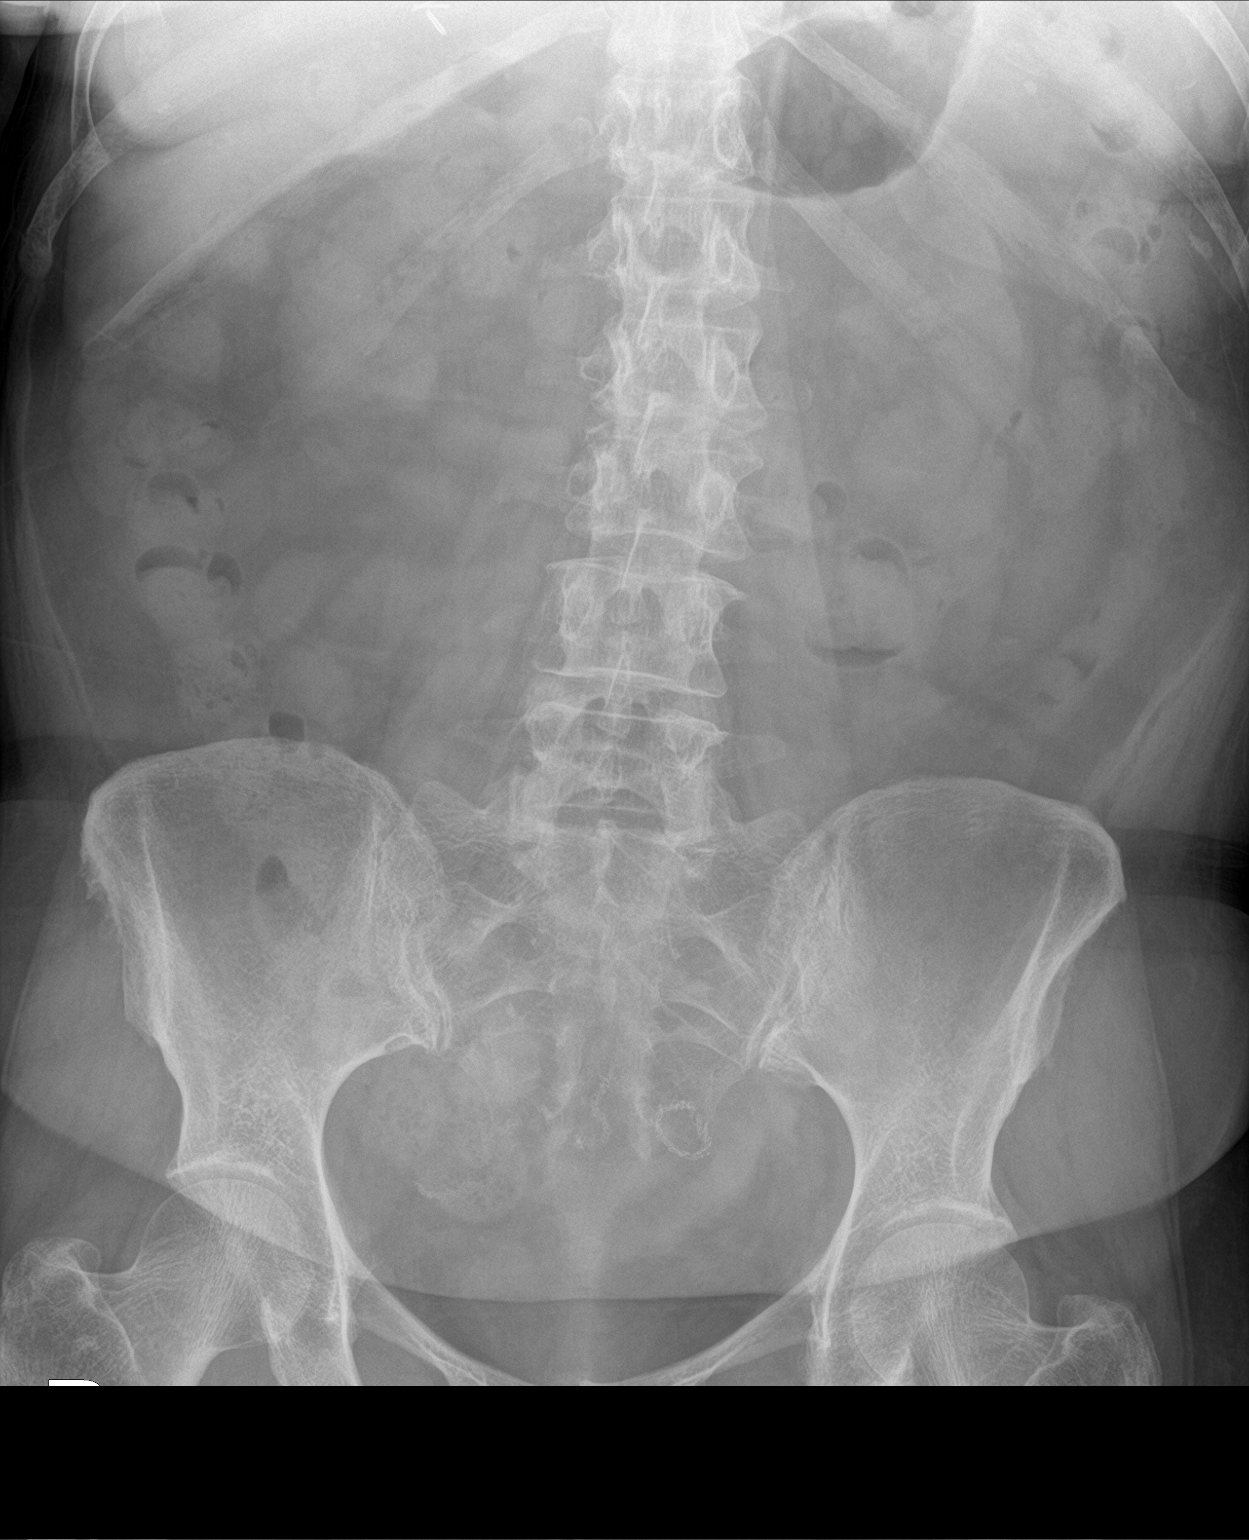

[2 of 2 positions shown; findings below may reference images not displayed]

FINDINGS: Visible lung bases show atelectasis at the left base. Surgical clips
in the right upper quadrant. Nonobstructed bowel-gas pattern with
moderate stool in the colon. Surgical sutures in the pelvis. No
radiopaque calculi.
IMPRESSION: Nonobstructed bowel-gas pattern.
